# Patient Record
Sex: Male | Born: 1950 | Race: Black or African American | Hispanic: No | Smoking: Former smoker
Health system: Southern US, Community
[De-identification: ages and names within clinical notes are randomized; demographics above are authoritative.]

## PROBLEM LIST (undated history)

## (undated) DIAGNOSIS — K859 Acute pancreatitis without necrosis or infection, unspecified: Secondary | ICD-10-CM

---

## 2006-12-29 ENCOUNTER — Ambulatory Visit: Payer: Self-pay | Admitting: Gastroenterology

## 2008-03-31 ENCOUNTER — Emergency Department: Payer: Self-pay | Admitting: Emergency Medicine

## 2013-11-06 ENCOUNTER — Ambulatory Visit: Payer: Self-pay | Admitting: Urology

## 2013-11-06 DIAGNOSIS — I10 Essential (primary) hypertension: Secondary | ICD-10-CM

## 2013-11-19 ENCOUNTER — Ambulatory Visit: Payer: Self-pay | Admitting: Urology

## 2013-12-04 DIAGNOSIS — N529 Male erectile dysfunction, unspecified: Secondary | ICD-10-CM | POA: Insufficient documentation

## 2013-12-04 DIAGNOSIS — R339 Retention of urine, unspecified: Secondary | ICD-10-CM | POA: Insufficient documentation

## 2013-12-04 DIAGNOSIS — N401 Enlarged prostate with lower urinary tract symptoms: Secondary | ICD-10-CM | POA: Insufficient documentation

## 2015-02-21 NOTE — Op Note (Signed)
PATIENT NAME:  Ruben Johnson, Ruben Johnson MR#:  [id number] DATE OF BIRTH:  Jul 17, 1951  DATE OF PROCEDURE:  11/19/2013  PRINCIPAL DIAGNOSIS: Organic impotence.   POSTOPERATIVE DIAGNOSIS: Organic impotence.  PROCEDURE: Inflatable penile prosthesis placement.   SURGEON: Assunta GamblesBrian Ajia Chadderdon, M.D.   ANESTHESIA: Laryngeal mask airway anesthesia.   INDICATIONS: The patient is a 64 year old African American gentleman with a long history of organic impotence. He has undergone evaluation and [facility] at [facility]. He was a candidate for an inflatable penile prosthesis. There was a decision by [facility] to refer out for prosthesis placement. He presents for this purpose.   DESCRIPTION OF PROCEDURE: After informed consent was obtained, the patient was taken to the operating room and placed in the supine position on the operating room table under laryngeal mask airway anesthesia. The patient was then prepped and draped in the usual standard fashion. A 10 minute scrub and prep was performed. Ioban drapes were placed over the suprapubic and scrotal region. A 16-French Foley catheter was easily placed to gravity drainage. Gloves were changed at that point. A suprapubic incision was made approximately 5 cm over the pubic symphysis to the base of the penis. The incision was continued down to expose the corpora bilaterally. The incision was also taken down to expose the suprapubic fascia. Once the corpora were adequately exposed, a 2-0 Vicryl suture was placed both medial and lateral in the posterolateral corpora. Once the stay sutures were in place, the left corpora was initially opened utilizing electrocautery and an approximate 1.5 cm incision was made. Reasonable blood flow was noted within the corpora. The corpora were then dilated in standard technique to maximum dilation, both proximal and distal. This was easily performed. The corpora was then irrigated with ample amounts of antibiotic irrigating solution. The right corpora was  similarly opened. It too was dilated to maximum dilation sequentially without problems, both proximal and distal. The right corpora was also irrigated with ample amounts of antibiotic irrigating solution. Once the irrigation was completed, measurements were obtained of an 8 cm proximal bilateral measurement with a 12 cm distal measurement bilaterally. The decision was made to utilize an 18 cm prosthesis with 2 cm rear tips. Due to the penile size and girth, the decision was also made to utilize the more expandable prosthesis. The 100 mL reservoir was chosen. As the prosthesis and reservoir was being prepped, a suprapubic incision was made through the fascia. A large cavity was then developed posterior to the rectus muscle and fascia of more than adequate size for the 100 mL reservoir. The area was also irrigated with ample amounts of antibiotic irrigating solution. A pocket was also developed in the right hemiscrotum anteriorly. This was also easily performed. With the prosthesis and reservoir prepared, the reservoir was initially placed. It was filled according to manufacturer recommendations. No backflow pressure was identified through the syringe. This indicates adequate pocket for the reservoir. The right prosthesis was initially placed. The suture was advanced through the needle was advanced into the needle passer. The needle passer was inserted through the distal corpora to the glans penis. The needle was then passed through the glans penis pulling the suture through. The prosthesis was advanced into the distal corpora. The proximal portion was advanced into the more proximal corpora. Good positioning was noted. The left prosthesis was similarly placed passing the needle through the needle passer and glans penis. It was advanced distally and then proximally with good positioning noted. The prosthesis was then cycled with good  erection noted. No kinking or buckling of the device was appreciated. Adequate length  was noted behind the glans penis. The prosthesis was then partially deflated. The corpora were sutured bilaterally utilizing 2-0 Vicryl suture in a running fashion. The midline suprapubic fascia was also closed utilizing a 2-0 Vicryl suture. The pump was placed into the right hemiscrotum in good position. The areas were once again irrigated with ample amounts of antibiotic irrigating solution. The tubing was trimmed utilizing standard method. The connectors were placed. The tubing was then connected also in standard fashion. The tubing was tucked into the subcutaneous tissue. This was closed in 2 layers utilizing 3-0 Vicryl suture. A third layer was then placed utilizing plain gut suture. The skin was closed utilizing a running 4-0 Vicryl subcuticular stitch. Steri-Strips, Telfa, and Tegaderm dressing was then applied. The indwelling Foley catheter was removed. The sutures in the glans penis were cut and removed appropriately. Pressure was applied to the glans penis for hemostasis. The prosthesis was once again cycled. Good function of the device was noted. The prosthesis was then partially deflated. It was placed in the locked position. The patient was then awakened from laryngeal mask airway anesthesia, was taken to the recovery room in stable condition. There were no problems or complications. The patient tolerated the procedure well.  ____________________________ Madolyn Frieze. Achilles Dunk, MD bsc:sb D: 11/19/2013 13:15:42 ET T: 11/19/2013 14:54:51 ET JOB#: [id number]  cc: Madolyn Frieze. Achilles Dunk, MD, <Dictator> Madolyn Frieze Carlisia Geno MD ELECTRONICALLY SIGNED 11/19/2013 16:39

## 2018-02-07 ENCOUNTER — Emergency Department
Admission: EM | Admit: 2018-02-07 | Discharge: 2018-02-07 | Disposition: A | Discharge status: Home / Self Care | Payer: Medicare Other | Attending: Student in an Organized Health Care Education/Training Program | Admitting: Student in an Organized Health Care Education/Training Program

## 2018-02-07 ENCOUNTER — Encounter: Payer: Self-pay | Admitting: Medical Oncology

## 2018-02-07 DIAGNOSIS — W01198A Fall on same level from slipping, tripping and stumbling with subsequent striking against other object, initial encounter: Secondary | ICD-10-CM | POA: Insufficient documentation

## 2018-02-07 DIAGNOSIS — Y92008 Other place in unspecified non-institutional (private) residence as the place of occurrence of the external cause: Secondary | ICD-10-CM | POA: Insufficient documentation

## 2018-02-07 DIAGNOSIS — Z23 Encounter for immunization: Secondary | ICD-10-CM | POA: Diagnosis not present

## 2018-02-07 DIAGNOSIS — Y999 Unspecified external cause status: Secondary | ICD-10-CM | POA: Diagnosis not present

## 2018-02-07 DIAGNOSIS — Y9389 Activity, other specified: Secondary | ICD-10-CM | POA: Insufficient documentation

## 2018-02-07 DIAGNOSIS — S0990XA Unspecified injury of head, initial encounter: Secondary | ICD-10-CM | POA: Insufficient documentation

## 2018-02-07 MED ORDER — TETANUS-DIPHTH-ACELL PERTUSSIS 5-2.5-18.5 LF-MCG/0.5 IM SUSP
0.5000 mL | Freq: Once | INTRAMUSCULAR | Status: AC
  Administered 2018-02-07: 0.5 mL via INTRAMUSCULAR
  Filled 2018-02-07: qty 0.5

## 2018-02-07 MED ORDER — BACITRACIN ZINC 500 UNIT/GM EX OINT
TOPICAL_OINTMENT | Freq: Once | CUTANEOUS | Status: AC
  Administered 2018-02-07: 18:00:00 via TOPICAL
  Filled 2018-02-07: qty 0.9

## 2018-02-07 NOTE — ED Triage Notes (Signed)
Pt ambulatory with reports of tripping and falling today and hitting the top of his head on concrete. Pt has small lac, bleeding controlled, denies use of blood thinner. No LOC. A/O x 4.

## 2018-02-07 NOTE — ED Provider Notes (Signed)
[facility] Emergency Department Provider Note  ____________________________________________   First MD Initiated Contact with Patient 02/07/18 1722     (approximate)  I have reviewed the triage vital signs and the nursing notes.   HISTORY  Chief Complaint Laceration    HPI Ruben Johnson is a 67 y.o. male presents emergency department complaining of a head injury.  He states he was sitting in his chair on his patio.  He got up tripped and fell hitting the top of his head on concrete.  He did not lose consciousness.  He has no headache.  He is not had any nausea or vomiting.  He states he put a Band-Aid on top of his head where he scraped.  He does not take an aspirin a day or any blood thinners.  History reviewed. No pertinent past medical history.  There are no active problems to display for this patient.   History reviewed. No pertinent surgical history.  Prior to Admission medications   Not on File    Allergies Patient has no known allergies.  No family history on file.  Social History Social History   Tobacco Use  . Smoking status: Not on file  Substance Use Topics  . Alcohol use: Not on file  . Drug use: Not on file    Review of Systems  Constitutional: No fever/chills, positive head injury  eyes: No visual changes. ENT: No sore throat. Respiratory: Denies cough Genitourinary: Negative for dysuria. Musculoskeletal: Negative for back pain. Skin: Negative for rash.  Positive for abrasion on the scalp    ____________________________________________   PHYSICAL EXAM:  VITAL SIGNS: ED Triage Vitals  Enc Vitals Group     BP 02/07/18 1657 131/75     Pulse Rate 02/07/18 1657 80     Resp 02/07/18 1657 16     Temp 02/07/18 1656 98 F (36.7 C)     Temp Source 02/07/18 1657 Oral     SpO2 02/07/18 1657 95 %     Weight 02/07/18 1657 200 lb (90.7 kg)     Height 02/07/18 1657 6' (1.829 m)     Head Circumference --      Peak  Flow --      Pain Score 02/07/18 1657 0     Pain Loc --      Pain Edu? --      Excl. in GC? --     Constitutional: Alert and oriented. Well appearing and in no acute distress.  Patient is laughing and talking.  He is acting appropriately Eyes: Conjunctivae are normal.  Head:  positive for an abrasion on the anterior scalp just above the forehead. Nose: No congestion/rhinnorhea. Mouth/Throat: Mucous membranes are moist.   Cardiovascular: Normal rate, regular rhythm.  Heart sounds are normal Respiratory: Normal respiratory effort.  No retractions, lungs are clear to auscultation GU: deferred Musculoskeletal: FROM all extremities, warm and well perfused Neurologic:  Normal speech and language.  Hernial nerves II through XII are grossly intact Skin:  Skin is warm, dry positive for abrasion to the scalp psychiatric: Mood and affect are normal. Speech and behavior are normal.  ____________________________________________   LABS (all labs ordered are listed, but only abnormal results are displayed)  Labs Reviewed - No data to display ____________________________________________   ____________________________________________  RADIOLOGY    ____________________________________________   PROCEDURES  Procedure(s) performed: No  Procedures    ____________________________________________   INITIAL IMPRESSION / ASSESSMENT AND PLAN / ED COURSE  Pertinent labs &  imaging results that were available during my care of the patient were reviewed by me and considered in my medical decision making (see chart for details).  Patient is a 67 year old male presents emergency department complaining of a head injury.  He states he tripped and fell on his patio.  He has had on concrete.  He did not lose consciousness.  Has no headache or nausea or vomiting.  On physical exam he appears well.  There is a small abrasion on the anterior part of the scalp.  Cranial nerves II through XII grossly  intact.  He is acting appropriately.  Discussed the option of a CT of the head due to his age.  Patient states he would rather wait to see if he worsens before doing a head CT.  He states mainly he needed a Tdap and a bandage put on his head.  I agree that it would be appropriate to wait at this time on a CT.  He is to return to the emergency department as soon as possible if he is having any signs of headache, nausea, vomiting or any other neurologic changes.  He was given a Tdap by nursing staff prior to discharge.     As part of my medical decision making, I reviewed the following data within the electronic MEDICAL RECORD NUMBER Nursing notes reviewed and incorporated, Old chart reviewed, Notes from prior ED visits and [place] Controlled Substance Database  ____________________________________________   FINAL CLINICAL IMPRESSION(S) / ED DIAGNOSES  Final diagnoses:  Minor head injury, initial encounter      NEW MEDICATIONS STARTED DURING THIS VISIT:  New Prescriptions   No medications on file     Note:  This document was prepared using Dragon voice recognition software and may include unintentional dictation errors.    Faythe Ghee, PA-C 02/07/18 1753    Willy Eddy, MD 02/07/18 2207

## 2018-02-07 NOTE — Discharge Instructions (Addendum)
Return to the emergency department if you are having a headache, nausea, vomiting or feel like you are getting worse.  Apply ice to any areas that hurt.  Return the emergency department if needed

## 2018-02-07 NOTE — ED Notes (Signed)
Pt ambulatory without difficulty. VSS. NAD. Discharge instruction, and follow up discussed. All questions answered.

## 2018-02-26 ENCOUNTER — Other Ambulatory Visit: Payer: Self-pay

## 2018-02-26 ENCOUNTER — Emergency Department: Payer: Medicare Other

## 2018-02-26 ENCOUNTER — Emergency Department
Admission: EM | Admit: 2018-02-26 | Discharge: 2018-02-26 | Disposition: A | Discharge status: Home / Self Care | Payer: Medicare Other | Attending: Emergency Medicine | Admitting: Emergency Medicine

## 2018-02-26 ENCOUNTER — Encounter: Payer: Self-pay | Admitting: Emergency Medicine

## 2018-02-26 DIAGNOSIS — M899 Disorder of bone, unspecified: Secondary | ICD-10-CM | POA: Insufficient documentation

## 2018-02-26 DIAGNOSIS — Z202 Contact with and (suspected) exposure to infections with a predominantly sexual mode of transmission: Secondary | ICD-10-CM | POA: Diagnosis not present

## 2018-02-26 DIAGNOSIS — R360 Urethral discharge without blood: Secondary | ICD-10-CM | POA: Insufficient documentation

## 2018-02-26 LAB — URINALYSIS, COMPLETE (UACMP) WITH MICROSCOPIC
Bacteria, UA: NONE SEEN
Bilirubin Urine: NEGATIVE
Glucose, UA: NEGATIVE mg/dL
HGB URINE DIPSTICK: NEGATIVE
KETONES UR: NEGATIVE mg/dL
Nitrite: NEGATIVE
PROTEIN: NEGATIVE mg/dL
Specific Gravity, Urine: 1.01 (ref 1.005–1.030)
pH: 5 (ref 5.0–8.0)

## 2018-02-26 LAB — CHLAMYDIA/NGC RT PCR (ARMC ONLY)
Chlamydia Tr: NOT DETECTED
N gonorrhoeae: NOT DETECTED

## 2018-02-26 MED ORDER — CEFTRIAXONE SODIUM 250 MG IJ SOLR
250.0000 mg | Freq: Once | INTRAMUSCULAR | Status: AC
  Administered 2018-02-26: 250 mg via INTRAMUSCULAR
  Filled 2018-02-26: qty 250

## 2018-02-26 MED ORDER — METRONIDAZOLE 500 MG PO TABS
2000.0000 mg | ORAL_TABLET | Freq: Once | ORAL | Status: AC
  Administered 2018-02-26: 2000 mg via ORAL
  Filled 2018-02-26: qty 4

## 2018-02-26 MED ORDER — AZITHROMYCIN 500 MG PO TABS
1000.0000 mg | ORAL_TABLET | Freq: Once | ORAL | Status: AC
  Administered 2018-02-26: 1000 mg via ORAL
  Filled 2018-02-26: qty 2

## 2018-02-26 NOTE — ED Provider Notes (Signed)
[facility] Emergency Department Provider Note  ____________________________________________  Time seen: Approximately 9:51 AM  I have reviewed the triage vital signs and the nursing notes.   HISTORY  Chief Complaint Hand Pain and Exposure to STD    HPI Ruben Johnson is a 67 y.o. male that presents to the emergency department for concern of STD exposure thumb bone irregularity for 1 year.  Patient states that he was told by a lady friend that she has an STD.  Patient states that he has had some yellow discharge and itching recently.  Patient states that his thumb has protruded out for the last year.  He has been told that he has arthritis.  It is not painful and does not bother him.  No trauma.  No fever, chills, nausea, vomiting, abdominal pain, numbness, tingling, penile lesions.   History reviewed. No pertinent past medical history.  There are no active problems to display for this patient.   History reviewed. No pertinent surgical history.  Prior to Admission medications   Not on File    Allergies Patient has no known allergies.  No family history on file.  Social History Social History   Tobacco Use  . Smoking status: Never Smoker  . Smokeless tobacco: Never Used  Substance Use Topics  . Alcohol use: Never    Frequency: Never  . Drug use: Never     Review of Systems  Constitutional: No fever/chills Cardiovascular: No chest pain. Respiratory: No SOB. Gastrointestinal: No abdominal pain.  No nausea, no vomiting.  Musculoskeletal: Negative for musculoskeletal pain. Skin: Negative for rash, abrasions, lacerations, ecchymosis. Neurological: Negative for numbness or tingling   ____________________________________________   PHYSICAL EXAM:  VITAL SIGNS: ED Triage Vitals [02/26/18 0655]  Enc Vitals Group     BP (!) 170/100     Pulse Rate 82     Resp 20     Temp 97.8 F (36.6 C)     Temp Source Oral     SpO2 98 %     Weight  200 lb (90.7 kg)     Height 6' (1.829 m)     Head Circumference      Peak Flow      Pain Score 0     Pain Loc      Pain Edu?      Excl. in GC?      Constitutional: Alert and oriented. Well appearing and in no acute distress. Eyes: Conjunctivae are normal. PERRL. EOMI. Head: Atraumatic. ENT:      Ears:      Nose: No congestion/rhinnorhea.      Mouth/Throat: Mucous membranes are moist.  Neck: No stridor.   Cardiovascular: Normal rate, regular rhythm.  Good peripheral circulation. Respiratory: Normal respiratory effort without tachypnea or retractions. Lungs CTAB. Good air entry to the bases with no decreased or absent breath sounds. Gastrointestinal: Bowel sounds 4 quadrants. Soft and nontender to palpation. No guarding or rigidity. No palpable masses. No distention.  Genitourinary: deferred Musculoskeletal: Full range of motion to all extremities. No gross deformities appreciated. Bony deformity of MCP joint of right thumb.  Neurologic:  Normal speech and language. No gross focal neurologic deficits are appreciated.  Skin:  Skin is warm, dry and intact. No rash noted. Psychiatric: Mood and affect are normal. Speech and behavior are normal. Patient exhibits appropriate insight and judgement.   ____________________________________________   LABS (all labs ordered are listed, but only abnormal results are displayed)  Labs Reviewed  URINALYSIS,  COMPLETE (UACMP) WITH MICROSCOPIC - Abnormal; Notable for the following components:      Result Value   Color, Urine YELLOW (*)    APPearance CLEAR (*)    Leukocytes, UA MODERATE (*)    All other components within normal limits  CHLAMYDIA/NGC RT PCR ([facility] ONLY)  URINE CULTURE   ____________________________________________  EKG   ____________________________________________  RADIOLOGY Lexine Baton, personally viewed and evaluated these images (plain radiographs) as part of my medical decision making, as well as  reviewing the written report by the radiologist.  Dg Hand Complete Left  Result Date: 02/26/2018 CLINICAL DATA:  Limited range of motion of left hand without known injury or pain. EXAM: LEFT HAND - COMPLETE 3+ VIEW COMPARISON:  None. FINDINGS: There is no evidence of fracture or dislocation. There is no evidence of arthropathy or other focal bone abnormality. Soft tissues are unremarkable. IMPRESSION: Normal left hand. Electronically Signed   By: Lupita Raider, M.D.   On: 02/26/2018 07:44    ____________________________________________    PROCEDURES  Procedure(s) performed:    Procedures    Medications  cefTRIAXone (ROCEPHIN) injection 250 mg (250 mg Intramuscular Given 02/26/18 0920)  azithromycin (ZITHROMAX) tablet 1,000 mg (1,000 mg Oral Given 02/26/18 0920)  metroNIDAZOLE (FLAGYL) tablet 2,000 mg (2,000 mg Oral Given 02/26/18 0920)     ____________________________________________   INITIAL IMPRESSION / ASSESSMENT AND PLAN / ED COURSE  Pertinent labs & imaging results that were available during my care of the patient were reviewed by me and considered in my medical decision making (see chart for details).  Review of the [place] CSRS was performed in accordance of the [facility] prior to dispensing any controlled drugs.   Patient's diagnosis is consistent with exposure to STD and symptoms consistent with arhrtitis. Vital signs and exam are reassuring. Hand xray negative for acute bony abnormalities. Exam is consistent with arthritis. Patient was given IM Ceftriaxone, oral azithromycin, oral flagyl for exposure to STD. Education about STDs was provided. Patient is to follow up with PCP as directed. Patient is given ED precautions to return to the ED for any worsening or new symptoms.   ____________________________________________  FINAL CLINICAL IMPRESSION(S) / ED DIAGNOSES  Final diagnoses:  Exposure to STD  Bone disorder      NEW MEDICATIONS STARTED DURING THIS VISIT:  ED  Discharge Orders    None          This chart was dictated using voice recognition software/Dragon. Despite best efforts to proofread, errors can occur which can change the meaning. Any change was purely unintentional.    Enid Derry, PA-C 02/26/18 1029    Don Perking, [place], MD 02/28/18 [phone]

## 2018-02-26 NOTE — ED Notes (Signed)
See triage note  Having pain and swelling to left hand  Mainly at index knuckle  Also states his lady friend told him to be checked for STD  He has had some penile discharge with some itching

## 2018-02-26 NOTE — ED Triage Notes (Signed)
Pt arrives ambulatory to triage with c/o left hand pain without injury and possible STD. Pt reports that he received a call from a "lady friend" that he may need to be checked out. Pt reports penile DC and itching. Pt is in NAD.

## 2018-02-27 LAB — URINE CULTURE: Culture: NO GROWTH

## 2018-06-01 ENCOUNTER — Emergency Department: Payer: Medicare Other

## 2018-06-01 ENCOUNTER — Encounter: Payer: Self-pay | Admitting: Emergency Medicine

## 2018-06-01 ENCOUNTER — Inpatient Hospital Stay
Admission: EM | Admit: 2018-06-01 | Discharge: 2018-06-04 | DRG: 440 | Disposition: A | Discharge status: Home / Self Care | Payer: Medicare Other | Attending: Internal Medicine | Admitting: Internal Medicine

## 2018-06-01 ENCOUNTER — Other Ambulatory Visit: Payer: Self-pay

## 2018-06-01 DIAGNOSIS — R1013 Epigastric pain: Secondary | ICD-10-CM | POA: Diagnosis present

## 2018-06-01 DIAGNOSIS — E876 Hypokalemia: Secondary | ICD-10-CM | POA: Diagnosis not present

## 2018-06-01 DIAGNOSIS — K760 Fatty (change of) liver, not elsewhere classified: Secondary | ICD-10-CM | POA: Diagnosis present

## 2018-06-01 DIAGNOSIS — Z87891 Personal history of nicotine dependence: Secondary | ICD-10-CM

## 2018-06-01 DIAGNOSIS — K852 Alcohol induced acute pancreatitis without necrosis or infection: Secondary | ICD-10-CM | POA: Diagnosis present

## 2018-06-01 DIAGNOSIS — K859 Acute pancreatitis without necrosis or infection, unspecified: Secondary | ICD-10-CM | POA: Diagnosis present

## 2018-06-01 LAB — CBC
HCT: 50.9 % (ref 40.0–52.0)
Hemoglobin: 17.6 g/dL (ref 13.0–18.0)
MCH: 31.9 pg (ref 26.0–34.0)
MCHC: 34.7 g/dL (ref 32.0–36.0)
MCV: 92 fL (ref 80.0–100.0)
Platelets: 430 10*3/uL (ref 150–440)
RBC: 5.53 MIL/uL (ref 4.40–5.90)
RDW: 13.5 % (ref 11.5–14.5)
WBC: 10.6 10*3/uL (ref 3.8–10.6)

## 2018-06-01 LAB — COMPREHENSIVE METABOLIC PANEL
ALT: 29 U/L (ref 0–44)
AST: 29 U/L (ref 15–41)
Albumin: 4.3 g/dL (ref 3.5–5.0)
Alkaline Phosphatase: 65 U/L (ref 38–126)
Anion gap: 16 — ABNORMAL HIGH (ref 5–15)
BILIRUBIN TOTAL: 0.9 mg/dL (ref 0.3–1.2)
BUN: 9 mg/dL (ref 8–23)
CO2: 26 mmol/L (ref 22–32)
Calcium: 9.1 mg/dL (ref 8.9–10.3)
Chloride: 100 mmol/L (ref 98–111)
Creatinine, Ser: 0.94 mg/dL (ref 0.61–1.24)
GFR calc Af Amer: >60 mL/min (ref >60)
GFR calc non Af Amer: >60 mL/min (ref >60)
GLUCOSE: 187 mg/dL — AB (ref 70–99)
Potassium: 3.8 mmol/L (ref 3.5–5.1)
Sodium: 142 mmol/L (ref 135–145)
Total Protein: 7.9 g/dL (ref 6.5–8.1)

## 2018-06-01 LAB — TROPONIN I: Troponin I: <0.03 ng/mL (ref <0.03)

## 2018-06-01 LAB — LIPASE, BLOOD: LIPASE: 1310 U/L — AB (ref 11–51)

## 2018-06-01 MED ORDER — ONDANSETRON HCL 4 MG PO TABS: 4.0000 mg | ORAL_TABLET | Freq: Four times a day (QID) | ORAL | Status: DC | PRN

## 2018-06-01 MED ORDER — ONDANSETRON HCL 4 MG/2ML IJ SOLN: 4.0000 mg | Freq: Four times a day (QID) | INTRAMUSCULAR | Status: DC | PRN

## 2018-06-01 MED ORDER — THIAMINE HCL 100 MG/ML IJ SOLN
100.0000 mg | Freq: Every day | INTRAMUSCULAR | Status: DC
  Administered 2018-06-01: 100 mg via INTRAVENOUS
  Filled 2018-06-01 (×4): qty 1

## 2018-06-01 MED ORDER — SODIUM CHLORIDE 0.9 % IV BOLUS
1000.0000 mL | Freq: Once | INTRAVENOUS | Status: AC
Start: 2018-06-01 — End: 2018-06-01
  Administered 2018-06-01: 1000 mL via INTRAVENOUS

## 2018-06-01 MED ORDER — HYDRALAZINE HCL 20 MG/ML IJ SOLN: 5.0000 mg | INTRAMUSCULAR | Status: DC | PRN

## 2018-06-01 MED ORDER — DEXTROSE-NACL 5-0.45 % IV SOLN
INTRAVENOUS | Status: DC
  Administered 2018-06-01 – 2018-06-03 (×4): via INTRAVENOUS

## 2018-06-01 MED ORDER — IOHEXOL 300 MG/ML  SOLN
100.0000 mL | Freq: Once | INTRAMUSCULAR | Status: AC | PRN
Start: 2018-06-01 — End: 2018-06-01
  Administered 2018-06-01: 100 mL via INTRAVENOUS

## 2018-06-01 MED ORDER — ENOXAPARIN SODIUM 40 MG/0.4ML ~~LOC~~ SOLN
40.0000 mg | SUBCUTANEOUS | Status: DC
  Administered 2018-06-01 – 2018-06-03 (×3): 40 mg via SUBCUTANEOUS
  Filled 2018-06-01 (×3): qty 0.4

## 2018-06-01 MED ORDER — LORAZEPAM 1 MG PO TABS: 1.0000 mg | ORAL_TABLET | Freq: Four times a day (QID) | ORAL | Status: DC | PRN

## 2018-06-01 MED ORDER — PROMETHAZINE HCL 25 MG/ML IJ SOLN
12.5000 mg | Freq: Four times a day (QID) | INTRAMUSCULAR | Status: DC | PRN
  Administered 2018-06-01: 12.5 mg via INTRAVENOUS
  Filled 2018-06-01: qty 1

## 2018-06-01 MED ORDER — VITAMIN B-1 100 MG PO TABS
100.0000 mg | ORAL_TABLET | Freq: Every day | ORAL | Status: DC
  Administered 2018-06-03 – 2018-06-04 (×2): 100 mg via ORAL
  Filled 2018-06-01 (×2): qty 1

## 2018-06-01 MED ORDER — LORAZEPAM 2 MG/ML IJ SOLN: 1.0000 mg | Freq: Four times a day (QID) | INTRAMUSCULAR | Status: DC | PRN

## 2018-06-01 MED ORDER — FOLIC ACID 1 MG PO TABS
1.0000 mg | ORAL_TABLET | Freq: Every day | ORAL | Status: DC
  Administered 2018-06-03 – 2018-06-04 (×2): 1 mg via ORAL
  Filled 2018-06-01 (×2): qty 1

## 2018-06-01 MED ORDER — ADULT MULTIVITAMIN W/MINERALS CH
1.0000 | ORAL_TABLET | Freq: Every day | ORAL | Status: DC
  Administered 2018-06-03 – 2018-06-04 (×2): 1 via ORAL
  Filled 2018-06-01 (×2): qty 1

## 2018-06-01 MED ORDER — FENTANYL CITRATE (PF) 100 MCG/2ML IJ SOLN
50.0000 ug | INTRAMUSCULAR | Status: DC | PRN
  Administered 2018-06-01: 50 ug via INTRAVENOUS

## 2018-06-01 MED ORDER — MORPHINE SULFATE (PF) 4 MG/ML IV SOLN
4.0000 mg | INTRAVENOUS | Status: DC | PRN
  Administered 2018-06-01 – 2018-06-03 (×10): 4 mg via INTRAVENOUS
  Filled 2018-06-01 (×10): qty 1

## 2018-06-01 MED ORDER — FENTANYL CITRATE (PF) 100 MCG/2ML IJ SOLN
INTRAMUSCULAR | Status: AC
  Filled 2018-06-01: qty 2

## 2018-06-01 NOTE — ED Notes (Signed)
Rachel RN, aware of bed assigned  

## 2018-06-01 NOTE — ED Provider Notes (Signed)
Ruben Johnson Provider Note    First MD Initiated Contact with Patient 06/01/18 1502     (approximate)  I have reviewed the triage vital signs and the nursing notes.   HISTORY  Chief Complaint Abdominal Pain    HPI Ruben Johnson is a 67 y.o. male with a history of alcohol use presents the ER with chief complaint of mid epigastric abdominal pain nonradiating that woke him from sleep at 6 AM.  Patient states he had multiple beers last night.  Is not been passing gas today.  No bowel movements.  No previous abdominal surgeries.  Has not taken anything for the pain.  States the pain is moderate to severe.  No chest pain or shortness of breath.  No fevers.  No new medications.  Is never had pain like this before.    History reviewed. No pertinent past medical history. History reviewed. No pertinent family history. History reviewed. No pertinent surgical history. There are no active problems to display for this patient.     Prior to Admission medications   Not on File    Allergies Patient has no known allergies.    Social History Social History   Tobacco Use  . Smoking status: Former Games developer  . Smokeless tobacco: Never Used  Substance Use Topics  . Alcohol use: Never    Frequency: Never  . Drug use: Never    Review of Systems Patient denies headaches, rhinorrhea, blurry vision, numbness, shortness of breath, chest pain, edema, cough, abdominal pain, nausea, vomiting, diarrhea, dysuria, fevers, rashes or hallucinations unless otherwise stated above in HPI. ____________________________________________   PHYSICAL EXAM:  VITAL SIGNS: Vitals:   06/01/18 1447 06/01/18 1500  BP:  138/64  Pulse: 81 75  Resp:    SpO2: (!) 89% 93%    Constitutional: Alert and oriented.  Eyes: Conjunctivae are normal.  Head: Atraumatic. Nose: No congestion/rhinnorhea. Mouth/Throat: Mucous membranes are moist.   Neck: No stridor.  Painless ROM.  Cardiovascular: Normal rate, regular rhythm. Grossly normal heart sounds.  Good peripheral circulation. Respiratory: Normal respiratory effort.  No retractions. Lungs CTAB. Gastrointestinal: Soft diffusely tender without peritonitis. No distention. No abdominal bruits. No CVA tenderness. Genitourinary: deferred Musculoskeletal: No lower extremity tenderness nor edema.  No joint effusions. Neurologic:  Normal speech and language. No gross focal neurologic deficits are appreciated. No facial droop Skin:  Skin is warm, dry and intact. No rash noted. Psychiatric: Mood and affect are normal. Speech and behavior are normal.  ____________________________________________   LABS (all labs ordered are listed, but only abnormal results are displayed)  Results for orders placed or performed during the hospital encounter of 06/01/18 (from the past 24 hour(s))  CBC     Status: None   Collection Time: 06/01/18  1:49 PM  Result Value Ref Range   WBC 10.6 3.8 - 10.6 K/uL   RBC 5.53 4.40 - 5.90 MIL/uL   Hemoglobin 17.6 13.0 - 18.0 g/dL   HCT 78.4 69.6 - 29.5 %   MCV 92.0 80.0 - 100.0 fL   MCH 31.9 26.0 - 34.0 pg   MCHC 34.7 32.0 - 36.0 g/dL   RDW 28.4 13.2 - 44.0 %   Platelets 430 150 - 440 K/uL  Comprehensive metabolic panel     Status: Abnormal   Collection Time: 06/01/18  2:55 PM  Result Value Ref Range   Sodium 142 135 - 145 mmol/L   Potassium 3.8 3.5 - 5.1 mmol/L   Chloride 100  98 - 111 mmol/L   CO2 26 22 - 32 mmol/L   Glucose, Bld 187 (H) 70 - 99 mg/dL   BUN 9 8 - 23 mg/dL   Creatinine, Ser 4.090.94 0.61 - 1.24 mg/dL   Calcium 9.1 8.9 - 81.110.3 mg/dL   Total Protein 7.9 6.5 - 8.1 g/dL   Albumin 4.3 3.5 - 5.0 g/dL   AST 29 15 - 41 U/L   ALT 29 0 - 44 U/L   Alkaline Phosphatase 65 38 - 126 U/L   Total Bilirubin 0.9 0.3 - 1.2 mg/dL   GFR calc non Af Amer >60 >60 mL/min   GFR calc Af Amer >60 >60 mL/min   Anion gap 16 (H) 5 - 15  Lipase, blood     Status: Abnormal    Collection Time: 06/01/18  2:55 PM  Result Value Ref Range   Lipase 1,310 (H) 11 - 51 U/L   ____________________________________________  EKG My review and personal interpretation at Time: 13:35   Indication: abd pain  Rate: 80  Rhythm: sinus Axis: normal Other: normal intervals, no stemi ____________________________________________  RADIOLOGY  I personally reviewed all radiographic images ordered to evaluate for the above acute complaints and reviewed radiology reports and findings.  These findings were personally discussed with the patient.  Please see medical record for radiology report.  ____________________________________________   PROCEDURES  Procedure(s) performed:  Procedures    Critical Care performed: no ____________________________________________   INITIAL IMPRESSION / ASSESSMENT AND PLAN / ED COURSE  Pertinent labs & imaging results that were available during my care of the patient were reviewed by me and considered in my medical decision making (see chart for details).   DDX: panceratitis, cholelithiasis, cholelcystitis, enteritis, sbo, AAA, dehydration  Ruben Johnson is a 67 y.o. who presents to the ED with symptoms as described above.  Patient afebrile hemodynamically stable but is very uncomfortable appearing.  Blood work will be sent for the above differential.  Will provide IV fluids as well as IV pain medication IV antiemetics.  CT imaging of the abdomen pelvis will be ordered for the above differential.  The patient will be placed on continuous pulse oximetry and telemetry for monitoring.  Laboratory evaluation will be sent to evaluate for the above complaints.     Clinical Course as of Jun 01 1628  Fri Jun 01, 2018  1555 My review of the CT abdomen it does appear the patient has acute pancreatitis.   [PR]  1624 His confirms acute pancreatitis at 1300.  Based on patient's persistent pain and symptoms patient will require hospitalization to keep  n.p.o. and for pain control.   [PR]    Clinical Course User Index [PR] Ruben Johnson, Ruben Brookshire, MD     As part of my medical decision making, I reviewed the following data within the electronic MEDICAL RECORD NUMBER Nursing notes reviewed and incorporated, Labs reviewed, notes from prior ED visits.   ____________________________________________   FINAL CLINICAL IMPRESSION(S) / ED DIAGNOSES  Final diagnoses:  Alcohol-induced acute pancreatitis without infection or necrosis  Epigastric pain      NEW MEDICATIONS STARTED DURING THIS VISIT:  New Prescriptions   No medications on file     Note:  This document was prepared using Dragon voice recognition software and may include unintentional dictation errors.    Ruben Johnson, Damontae Loppnow, MD 06/01/18 [phone]

## 2018-06-01 NOTE — ED Notes (Signed)
Report given to Dedra, RN.  

## 2018-06-01 NOTE — Plan of Care (Signed)
  Problem: Education: Goal: Knowledge of General Education information will improve Description: Including pain rating scale, medication(s)/side effects and non-pharmacologic comfort measures Outcome: Progressing   Problem: Health Behavior/Discharge Planning: Goal: Ability to manage health-related needs will improve Outcome: Progressing   Problem: Pain Managment: Goal: General experience of comfort will improve Outcome: Progressing   

## 2018-06-01 NOTE — H&P (Addendum)
[facility] - [place] at [facility]   PATIENT NAME: Ruben Johnson    MR#:  [id number]  DATE OF BIRTH:  06-04-1951  DATE OF ADMISSION:  06/01/2018  PRIMARY CARE PHYSICIAN: Patient, No Pcp Per   REQUESTING/REFERRING PHYSICIAN: Willy Eddy, MD  CHIEF COMPLAINT:   Chief Complaint  Patient presents with  . Abdominal Pain    HISTORY OF PRESENT ILLNESS:  Ruben Johnson  is a 67 y.o. male with a known history of alcohol use presenting to the ED with epigastric abdominal pain that started this morning around 6 AM.  Patient describes the pain as "gnawing".  He has not tried taking anything for the pain.  He has had nausea and 8 episodes of emesis today.  His last bowel movement was yesterday and was normal.  He denies any hematemesis or hematochezia.  He denies any fevers or chills.  No chest pain or shortness of breath.  This is never happened to him before.  He drinks 3-4 beers daily.  PAST MEDICAL HISTORY:  History reviewed. No pertinent past medical history.  PAST SURGICAL HISTORY:  History reviewed. No pertinent surgical history.    No chest pain  SOCIAL HISTORY:   Social History   Tobacco Use  . Smoking status: Former Games developer  . Smokeless tobacco: Never Used  Substance Use Topics  . Alcohol use: Never    Frequency: Never    FAMILY HISTORY:  Father- cancer (unknowne type)  DRUG ALLERGIES:  No Known Allergies  REVIEW OF SYSTEMS:   Review of Systems  Constitutional: Negative for chills and fever.  HENT: Negative for congestion and sore throat.   Eyes: Negative for blurred vision and double vision.  Respiratory: Negative for cough and shortness of breath.   Cardiovascular: Negative for chest pain and leg swelling.  Gastrointestinal: Positive for abdominal pain, nausea and vomiting. Negative for blood in stool, constipation, diarrhea and melena.  Genitourinary: Negative for dysuria and frequency.  Musculoskeletal: Negative for back pain and neck  pain.  Neurological: Negative for dizziness and headaches.  Psychiatric/Behavioral: Negative for depression. The patient is not nervous/anxious.      MEDICATIONS AT HOME:   Prior to Admission medications   Not on File      VITAL SIGNS:  Blood pressure 138/64, pulse 75, resp. rate (!) 22, height 6' (1.829 m), weight 90.7 kg (200 lb), SpO2 93 %.  PHYSICAL EXAMINATION:  Physical Exam  GENERAL:  68 y.o.-year-old patient lying in the bed, appears to be in pain. EYES: Pupils equal, round, reactive to light and accommodation. No scleral icterus. Extraocular muscles intact.  HEENT: Head atraumatic, normocephalic. Oropharynx and nasopharynx clear.  NECK:  Supple, no jugular venous distention. No thyroid enlargement, no tenderness.  LUNGS: Normal breath sounds bilaterally, no wheezing, rales, rhonchi or crepitation. No use of accessory muscles of respiration.  CARDIOVASCULAR: S1, S2 normal. No murmurs, rubs, or gallops.  ABDOMEN: Soft, significant tenderness to palpation of the epigastric area.  Bowel sounds present. No organomegaly or mass.  No rebound, no guarding.  Murphy's sign negative. EXTREMITIES: No pedal edema, cyanosis, or clubbing.  NEUROLOGIC: Cranial nerves II through XII are intact. Muscle strength 5/5 in all extremities. Sensation intact. Gait not checked.  PSYCHIATRIC: The patient is alert and oriented x 3.  SKIN: No obvious rash, lesion, or ulcer.   LABORATORY PANEL:   CBC Recent Labs  Lab 06/01/18 1349  WBC 10.6  HGB 17.6  HCT 50.9  PLT 430   ------------------------------------------------------------------------------------------------------------------  Chemistries  Recent Labs  Lab 06/01/18 1455  NA 142  K 3.8  CL 100  CO2 26  GLUCOSE 187*  BUN 9  CREATININE 0.94  CALCIUM 9.1  AST 29  ALT 29  ALKPHOS 65  BILITOT 0.9   ------------------------------------------------------------------------------------------------------------------  Cardiac  Enzymes No results for input(s): TROPONINI in the last 168 hours. ------------------------------------------------------------------------------------------------------------------  RADIOLOGY:  Ct Abdomen Pelvis W Contrast  Result Date: 06/01/2018 CLINICAL DATA:  Nausea and vomiting. Bowel obstruction, high-grade. The abdominal pain, acute, generalized. Symptoms began at 6 a.m. today. EXAM: CT ABDOMEN AND PELVIS WITH CONTRAST TECHNIQUE: Multidetector CT imaging of the abdomen and pelvis was performed using the standard protocol following bolus administration of intravenous contrast. CONTRAST:  OMNIPAQUE IOHEXOL 300 MG/ML  SOLN COMPARISON:  None. FINDINGS: Lower chest: Mild dependent atelectasis is present at both lung bases. Heart size is normal. No significant pleural or pericardial effusion is present. Hepatobiliary: There is diffuse fatty infiltration liver. No focal lesions are present. The common bile duct and gallbladder are normal. Pancreas: Extensive inflammatory changes are present at the pancreas. No abscess or fluid collection is present. There is no cystic lesion or obstruction. Spleen: Normal in size without focal abnormality. Adrenals/Urinary Tract: Adrenal glands are within normal limits bilaterally. A 2 cm simple cyst is noted anteriorly in the left kidney. No other focal mass lesion is present. There is no stone. Ureters are within normal limits. The urinary bladder is normal. Stomach/Bowel: There is mild distention of the stomach. Secondary inflammatory changes are present in the duodenum. This may result in some functional obstruction. Small bowel is otherwise unremarkable. Terminal ileum is within normal limits. The appendix is visualized and normal. The ascending and transverse colon is normal. Descending and sigmoid colon are mostly collapsed. Vascular/Lymphatic: Atherosclerotic calcifications are present in the aorta without aneurysm. No significant adenopathy is present.  Reproductive: Penile prosthesis is noted. Prostate is mildly enlarged. Other: No abdominal wall hernia or abnormality. Retroperitoneal fluid is noted at the level the pancreas, asymmetric to the right. Musculoskeletal: Vertebral body heights alignment are maintained. Pelvis is normal. Degenerative changes are noted at the hips bilaterally. IMPRESSION: 1. Acute pancreatitis without complicating features. No pseudocyst or abscess. 2. No obstructing mass. 3. Retroperitoneal reactive fluid is asymmetric to the right. 4.  Aortic Atherosclerosis (ICD10-I70.0). 5. Hepatic steatosis. Electronically Signed   By: Marin Roberts M.D.   On: 06/01/2018 15:58      IMPRESSION AND PLAN:   1. Acute pancreatitis- likely due to alcohol use. Lipase 1300. CT abdomen/pelvis did not show pseudocyst or abscess, also showed normal-appearing gallbladder and CBD. - Morphine for pain, Zofran for nausea - NPO for now, can advance diet as tolerated - MIVFs  2. Alcohol use- drinks 3-4 beers daily - CIWA - MVI, thiamine, folate  3. Elevated BP- does not have a history of HTN and not on any BP meds at home. Intermittently elevated BPs in the ED may be secondary to pain. - IV Hydralazine prn - Monitor  All the records are reviewed and case discussed with ED provider. Management plans discussed with the patient, family and they are in agreement.  CODE STATUS: FULL  TOTAL TIME TAKING CARE OF THIS PATIENT: 35 minutes.    Ruben Johnson M.D on 06/01/2018 at 4:38 PM  Between 7am to 6pm - Pager - [phone]  After 6pm go to [email or web address] - Social research officer, government  [facility] [place] [facility]  Office  [phone]  CC: Primary care physician; Patient, No  Pcp Per   Note: This dictation was prepared with Dragon dictation along with smaller phrase technology. Any transcriptional errors that result from this process are unintentional.

## 2018-06-01 NOTE — ED Triage Notes (Signed)
Pt arrived via EMS from home with reports of generalized abdominal pain that started around 6am this morning.  Pt c/o nausea and vomiting as well.   Pt describes the pain as constant and is sitting bent over in wheelchair.

## 2018-06-01 NOTE — ED Notes (Signed)
Attempted to call report. Floor refusing to accept report at this time. Will call back in 10 minutes.

## 2018-06-02 LAB — COMPREHENSIVE METABOLIC PANEL
ALT: 22 U/L (ref 0–44)
AST: 25 U/L (ref 15–41)
Albumin: 3.7 g/dL (ref 3.5–5.0)
Alkaline Phosphatase: 58 U/L (ref 38–126)
Anion gap: 7 (ref 5–15)
BILIRUBIN TOTAL: 0.5 mg/dL (ref 0.3–1.2)
BUN: 10 mg/dL (ref 8–23)
CALCIUM: 8.5 mg/dL — AB (ref 8.9–10.3)
CHLORIDE: 104 mmol/L (ref 98–111)
CO2: 29 mmol/L (ref 22–32)
Creatinine, Ser: 0.7 mg/dL (ref 0.61–1.24)
Glucose, Bld: 188 mg/dL — ABNORMAL HIGH (ref 70–99)
Potassium: 4 mmol/L (ref 3.5–5.1)
Sodium: 140 mmol/L (ref 135–145)
TOTAL PROTEIN: 7.2 g/dL (ref 6.5–8.1)

## 2018-06-02 LAB — CBC
HEMATOCRIT: 51 % (ref 40.0–52.0)
Hemoglobin: 17.6 g/dL (ref 13.0–18.0)
MCH: 31.8 pg (ref 26.0–34.0)
MCHC: 34.6 g/dL (ref 32.0–36.0)
MCV: 92 fL (ref 80.0–100.0)
PLATELETS: 207 10*3/uL (ref 150–440)
RBC: 5.54 MIL/uL (ref 4.40–5.90)
RDW: 13.9 % (ref 11.5–14.5)
WBC: 8.2 10*3/uL (ref 3.8–10.6)

## 2018-06-02 LAB — LIPASE, BLOOD: LIPASE: 992 U/L — AB (ref 11–51)

## 2018-06-02 NOTE — Progress Notes (Signed)
[facility] - [place] at [facility]   PATIENT NAME: Ruben NielsenSherman Johnson    MR#:  [id number]  DATE OF BIRTH:  07/22/1951  SUBJECTIVE:  CHIEF COMPLAINT:   Chief Complaint  Patient presents with  . Abdominal Pain  feels some better, pain 7/10, morphine helps. Says it's first time and wish no one gets this disease, agreeable to stay NPO REVIEW OF SYSTEMS:  Review of Systems  Constitutional: Negative for chills, fever and weight loss.  HENT: Negative for nosebleeds and sore throat.   Eyes: Negative for blurred vision.  Respiratory: Negative for cough, shortness of breath and wheezing.   Cardiovascular: Negative for chest pain, orthopnea, leg swelling and PND.  Gastrointestinal: Positive for abdominal pain. Negative for constipation, diarrhea, heartburn, nausea and vomiting.  Genitourinary: Negative for dysuria and urgency.  Musculoskeletal: Negative for back pain.  Skin: Negative for rash.  Neurological: Negative for dizziness, speech change, focal weakness and headaches.  Endo/Heme/Allergies: Does not bruise/bleed easily.  Psychiatric/Behavioral: Negative for depression.   DRUG ALLERGIES:  No Known Allergies VITALS:  Blood pressure (!) 145/86, pulse 80, temperature 98.8 F (37.1 C), temperature source Oral, resp. rate 18, height 6' (1.829 m), weight 87 kg (191 lb 14.4 oz), SpO2 95 %. PHYSICAL EXAMINATION:  Physical Exam  Constitutional: He is oriented to person, place, and time.  HENT:  Head: Normocephalic and atraumatic.  Eyes: Pupils are equal, round, and reactive to light. Conjunctivae and EOM are normal.  Neck: Normal range of motion. Neck supple. No tracheal deviation present. No thyromegaly present.  Cardiovascular: Normal rate, regular rhythm and normal heart sounds.  Pulmonary/Chest: Effort normal and breath sounds normal. No respiratory distress. He has no wheezes. He exhibits no tenderness.  Abdominal: Soft. Bowel sounds are normal. He exhibits  distension. There is generalized tenderness.  Musculoskeletal: Normal range of motion.  Neurological: He is alert and oriented to person, place, and time. No cranial nerve deficit.  Skin: Skin is warm and dry. No rash noted.   LABORATORY PANEL:  Male CBC Recent Labs  Lab 06/02/18 0344  WBC 8.2  HGB 17.6  HCT 51.0  PLT 207   ------------------------------------------------------------------------------------------------------------------ Chemistries  Recent Labs  Lab 06/02/18 0344  NA 140  K 4.0  CL 104  CO2 29  GLUCOSE 188*  BUN 10  CREATININE 0.70  CALCIUM 8.5*  AST 25  ALT 22  ALKPHOS 58  BILITOT 0.5   RADIOLOGY:  Ct Abdomen Pelvis W Contrast  Result Date: 06/01/2018 CLINICAL DATA:  Nausea and vomiting. Bowel obstruction, high-grade. The abdominal pain, acute, generalized. Symptoms began at 6 a.m. today. EXAM: CT ABDOMEN AND PELVIS WITH CONTRAST TECHNIQUE: Multidetector CT imaging of the abdomen and pelvis was performed using the standard protocol following bolus administration of intravenous contrast. CONTRAST:  100mL OMNIPAQUE IOHEXOL 300 MG/ML  SOLN COMPARISON:  None. FINDINGS: Lower chest: Mild dependent atelectasis is present at both lung bases. Heart size is normal. No significant pleural or pericardial effusion is present. Hepatobiliary: There is diffuse fatty infiltration liver. No focal lesions are present. The common bile duct and gallbladder are normal. Pancreas: Extensive inflammatory changes are present at the pancreas. No abscess or fluid collection is present. There is no cystic lesion or obstruction. Spleen: Normal in size without focal abnormality. Adrenals/Urinary Tract: Adrenal glands are within normal limits bilaterally. A 2 cm simple cyst is noted anteriorly in the left kidney. No other focal mass lesion is present. There is no stone. Ureters are within normal limits. The  urinary bladder is normal. Stomach/Bowel: There is mild distention of the stomach.  Secondary inflammatory changes are present in the duodenum. This may result in some functional obstruction. Small bowel is otherwise unremarkable. Terminal ileum is within normal limits. The appendix is visualized and normal. The ascending and transverse colon is normal. Descending and sigmoid colon are mostly collapsed. Vascular/Lymphatic: Atherosclerotic calcifications are present in the aorta without aneurysm. No significant adenopathy is present. Reproductive: Penile prosthesis is noted. Prostate is mildly enlarged. Other: No abdominal wall hernia or abnormality. Retroperitoneal fluid is noted at the level the pancreas, asymmetric to the right. Musculoskeletal: Vertebral body heights alignment are maintained. Pelvis is normal. Degenerative changes are noted at the hips bilaterally. IMPRESSION: 1. Acute pancreatitis without complicating features. No pseudocyst or abscess. 2. No obstructing mass. 3. Retroperitoneal reactive fluid is asymmetric to the right. 4.  Aortic Atherosclerosis (ICD10-I70.0). 5. Hepatic steatosis. Electronically Signed   By: Marin Roberts M.D.   On: 06/01/2018 15:58   ASSESSMENT AND PLAN:  67 y.o. male with a known history of alcohol use admitted for acute pancreatitis  1. Acute pancreatitis- likely due to alcohol use. Lipase 1300. CT abdomen/pelvis did not show pseudocyst or abscess, also showed normal-appearing gallbladder and CBD. - Morphine for pain, Zofran for nausea - NPO for now, can advance diet as tolerated - continue IVFs - recheck lipase today and in am  2. Alcohol use- drinks 3-4 beers daily - CIWA - MVI, thiamine, folate  3. Elevated BP- does not have a history of HTN and not on any BP meds at home. Intermittently elevated BPs may be secondary to pain. - IV Hydralazine prn - Monitor  4. 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 Management plans discussed with the patient, nursing and they are in agreement.  CODE STATUS: Full Code  TOTAL TIME TAKING CARE OF THIS PATIENT: 35 minutes.   More than 50% of the time was spent in counseling/coordination of care: YES  POSSIBLE D/C IN 2-3 DAYS, DEPENDING ON CLINICAL CONDITION.   Delfino Lovett M.D on 06/02/2018 at 7:37 AM  Between 7am to 6pm - Pager - [phone]  After 6pm go to [email or web address] - Social research officer, government  [facility] [place] [facility]  Office  [phone]  CC: Primary care physician; Patient, No Pcp Per  Note: This dictation was prepared with Dragon dictation along with smaller phrase technology. Any transcriptional errors that result from this process are unintentional.

## 2018-06-02 NOTE — Plan of Care (Signed)
  Problem: Education: Goal: Knowledge of General Education information will improve Description Including pain rating scale, medication(s)/side effects and non-pharmacologic comfort measures Outcome: Progressing   

## 2018-06-02 NOTE — Plan of Care (Signed)
  Problem: Education: Goal: Knowledge of General Education information will improve Description: Including pain rating scale, medication(s)/side effects and non-pharmacologic comfort measures Outcome: Progressing   Problem: Health Behavior/Discharge Planning: Goal: Ability to manage health-related needs will improve Outcome: Progressing   Problem: Clinical Measurements: Goal: Ability to maintain clinical measurements within normal limits will improve Outcome: Progressing Goal: Diagnostic test results will improve Outcome: Progressing   Problem: Pain Managment: Goal: General experience of comfort will improve Outcome: Progressing   

## 2018-06-03 LAB — CBC
HCT: 49.5 % (ref 40.0–52.0)
Hemoglobin: 17 g/dL (ref 13.0–18.0)
MCH: 31.7 pg (ref 26.0–34.0)
MCHC: 34.3 g/dL (ref 32.0–36.0)
MCV: 92.4 fL (ref 80.0–100.0)
PLATELETS: 179 10*3/uL (ref 150–440)
RBC: 5.36 MIL/uL (ref 4.40–5.90)
RDW: 13.7 % (ref 11.5–14.5)
WBC: 9 10*3/uL (ref 3.8–10.6)

## 2018-06-03 LAB — COMPREHENSIVE METABOLIC PANEL
ALT: 15 U/L (ref 0–44)
AST: 17 U/L (ref 15–41)
Albumin: 3 g/dL — ABNORMAL LOW (ref 3.5–5.0)
Alkaline Phosphatase: 53 U/L (ref 38–126)
Anion gap: 8 (ref 5–15)
BILIRUBIN TOTAL: 0.7 mg/dL (ref 0.3–1.2)
BUN: 6 mg/dL — AB (ref 8–23)
CHLORIDE: 102 mmol/L (ref 98–111)
CO2: 29 mmol/L (ref 22–32)
CREATININE: 0.86 mg/dL (ref 0.61–1.24)
Calcium: 7.5 mg/dL — ABNORMAL LOW (ref 8.9–10.3)
GFR calc Af Amer: >60 mL/min (ref >60)
Glucose, Bld: 156 mg/dL — ABNORMAL HIGH (ref 70–99)
POTASSIUM: 3.4 mmol/L — AB (ref 3.5–5.1)
Sodium: 139 mmol/L (ref 135–145)
TOTAL PROTEIN: 6.4 g/dL — AB (ref 6.5–8.1)

## 2018-06-03 LAB — HIV ANTIBODY (ROUTINE TESTING W REFLEX): HIV Screen 4th Generation wRfx: NONREACTIVE

## 2018-06-03 LAB — LIPASE, BLOOD: Lipase: 104 U/L — ABNORMAL HIGH (ref 11–51)

## 2018-06-03 MED ORDER — MORPHINE SULFATE (PF) 2 MG/ML IV SOLN: 2.0000 mg | INTRAVENOUS | Status: DC | PRN

## 2018-06-03 MED ORDER — SENNOSIDES-DOCUSATE SODIUM 8.6-50 MG PO TABS
2.0000 | ORAL_TABLET | Freq: Two times a day (BID) | ORAL | Status: DC
  Administered 2018-06-03 – 2018-06-04 (×3): 2 via ORAL
  Filled 2018-06-03 (×3): qty 2

## 2018-06-03 MED ORDER — POTASSIUM CHLORIDE CRYS ER 20 MEQ PO TBCR
40.0000 meq | EXTENDED_RELEASE_TABLET | Freq: Once | ORAL | Status: AC
  Administered 2018-06-03: 40 meq via ORAL
  Filled 2018-06-03: qty 2

## 2018-06-03 NOTE — Progress Notes (Signed)
Patient tolerating clear liquid diet, no complaints. Ruben Johnson,Ruben Crable S, RN

## 2018-06-03 NOTE — Progress Notes (Signed)
[facility] - [place] at [facility]   PATIENT NAME: Ruben Johnson    MR#:  [id number]  DATE OF BIRTH:  03/02/1951  SUBJECTIVE:  CHIEF COMPLAINT:   Chief Complaint  Patient presents with  . Abdominal Pain  feels much better, pain 2/10, morphine helps. Would like to start CLD REVIEW OF SYSTEMS:  Review of Systems  Constitutional: Negative for chills, fever and weight loss.  HENT: Negative for nosebleeds and sore throat.   Eyes: Negative for blurred vision.  Respiratory: Negative for cough, shortness of breath and wheezing.   Cardiovascular: Negative for chest pain, orthopnea, leg swelling and PND.  Gastrointestinal: Positive for abdominal pain. Negative for constipation, diarrhea, heartburn, nausea and vomiting.  Genitourinary: Negative for dysuria and urgency.  Musculoskeletal: Negative for back pain.  Skin: Negative for rash.  Neurological: Negative for dizziness, speech change, focal weakness and headaches.  Endo/Heme/Allergies: Does not bruise/bleed easily.  Psychiatric/Behavioral: Negative for depression.   DRUG ALLERGIES:  No Known Allergies VITALS:  Blood pressure 134/84, pulse 85, temperature 98.8 F (37.1 C), temperature source Oral, resp. rate 18, height 6' (1.829 m), weight 87 kg (191 lb 14.4 oz), SpO2 97 %. PHYSICAL EXAMINATION:  Physical Exam  Constitutional: He is oriented to person, place, and time.  HENT:  Head: Normocephalic and atraumatic.  Eyes: Pupils are equal, round, and reactive to light. Conjunctivae and EOM are normal.  Neck: Normal range of motion. Neck supple. No tracheal deviation present. No thyromegaly present.  Cardiovascular: Normal rate, regular rhythm and normal heart sounds.  Pulmonary/Chest: Effort normal and breath sounds normal. No respiratory distress. He has no wheezes. He exhibits no tenderness.  Abdominal: Soft. Bowel sounds are normal. He exhibits distension. There is generalized tenderness.  Musculoskeletal:  Normal range of motion.  Neurological: He is alert and oriented to person, place, and time. No cranial nerve deficit.  Skin: Skin is warm and dry. No rash noted.   LABORATORY PANEL:  Male CBC Recent Labs  Lab 06/03/18 0343  WBC 9.0  HGB 17.0  HCT 49.5  PLT 179   ------------------------------------------------------------------------------------------------------------------ Chemistries  Recent Labs  Lab 06/03/18 0343  NA 139  K 3.4*  CL 102  CO2 29  GLUCOSE 156*  BUN 6*  CREATININE 0.86  CALCIUM 7.5*  AST 17  ALT 15  ALKPHOS 53  BILITOT 0.7   RADIOLOGY:  No results found. ASSESSMENT AND PLAN:  67 y.o. male with a known history of alcohol use admitted for acute pancreatitis  1. Acute pancreatitis- likely due to alcohol use. Lipase 1300->992->104. CT abdomen/pelvis did not show pseudocyst or abscess, also showed normal-appearing gallbladder and CBD. - cut back on Morphine, prn Zofran for nausea - start CLD, can advance diet as tolerated  2. Alcohol use- drinks 3-4 beers daily - CIWA - MVI, thiamine, folate  3. Elevated BP- does not have a history of HTN and not on any BP meds at home. Intermittently elevated BPs may be secondary to pain. - IV Hydralazine prn - Monitor  4. Hypokalemia: replete and recheck  5. 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 Management plans discussed with the patient, nursing and they are in agreement.  CODE STATUS: Full Code  TOTAL TIME TAKING CARE OF THIS PATIENT: 35 minutes.   More than 50% of the time was spent in  counseling/coordination of care: YES  POSSIBLE D/C IN 1-2 DAYS, DEPENDING ON CLINICAL CONDITION.   Delfino Lovett M.D on 06/03/2018 at 7:50 AM  Between 7am to 6pm - Pager - [phone]  After 6pm go to [email or web address] - Air traffic controller  [facility] [place] [facility]  Office  [phone]  CC: Primary care physician; Patient, No Pcp Per  Note: This dictation was prepared with Dragon dictation along with smaller phrase technology. Any transcriptional errors that result from this process are unintentional.

## 2018-06-04 LAB — BASIC METABOLIC PANEL
Anion gap: 8 (ref 5–15)
BUN: 11 mg/dL (ref 8–23)
CO2: 31 mmol/L (ref 22–32)
Calcium: 8 mg/dL — ABNORMAL LOW (ref 8.9–10.3)
Chloride: 102 mmol/L (ref 98–111)
Creatinine, Ser: 0.87 mg/dL (ref 0.61–1.24)
GFR calc non Af Amer: >60 mL/min (ref >60)
Glucose, Bld: 127 mg/dL — ABNORMAL HIGH (ref 70–99)
Potassium: 3.5 mmol/L (ref 3.5–5.1)
Sodium: 141 mmol/L (ref 135–145)

## 2018-06-04 LAB — CBC
HEMATOCRIT: 45.9 % (ref 40.0–52.0)
HEMOGLOBIN: 16.1 g/dL (ref 13.0–18.0)
MCH: 32.5 pg (ref 26.0–34.0)
MCHC: 35 g/dL (ref 32.0–36.0)
MCV: 92.7 fL (ref 80.0–100.0)
Platelets: 175 10*3/uL (ref 150–440)
RBC: 4.95 MIL/uL (ref 4.40–5.90)
RDW: 13.3 % (ref 11.5–14.5)
WBC: 10.3 10*3/uL (ref 3.8–10.6)

## 2018-06-04 LAB — LIPASE, BLOOD: Lipase: 30 U/L (ref 11–51)

## 2018-06-04 MED ORDER — BISACODYL 10 MG RE SUPP
10.0000 mg | Freq: Once | RECTAL | Status: DC
  Filled 2018-06-04: qty 1

## 2018-06-04 MED ORDER — ONDANSETRON HCL 4 MG PO TABS: 4.0000 mg | ORAL_TABLET | Freq: Four times a day (QID) | ORAL | 0 refills | Status: DC | PRN

## 2018-06-04 MED ORDER — SENNOSIDES-DOCUSATE SODIUM 8.6-50 MG PO TABS: 2.0000 | ORAL_TABLET | Freq: Two times a day (BID) | ORAL | 0 refills | Status: DC

## 2018-06-04 NOTE — Discharge Instructions (Signed)
It was so nice to meet you! I'm glad your stomach pain is getting better.  You came into the hospital because you had inflammation of your pancreas. This can happen due to alcohol use. I have prescribed two medications for you to use at home as needed: 1. Senokot-S (to help with constipation)- take two tablets twice a day as needed; 2. Zofran (nausea medication)- take 1 tablet by mouth every 6 hours as needed for nausea.

## 2018-06-04 NOTE — Care Management Important Message (Signed)
Important Message  Patient Details  Name: Aida PufferSherman J Ditmars MRN: [id number] Date of Birth: [birth year]   Medicare Important Message Given:  Yes    Olegario MessierKathy A Teralyn Mullins 06/04/2018, 10:53 AM

## 2018-06-04 NOTE — Plan of Care (Signed)
Pt  Tolerating advanced diet.  No nausea/pain.  Lipase improved to 30 today.  Removed IV, reviewed d/c instructions and made diet recommendations - low fat, reduced sugar, reduced alcohol intake.  Pt was in [employer] for over 30 years and [facility].  Daughter came to pick up patient.  Going home with PRN zofran and sennakot.

## 2018-06-04 NOTE — Discharge Summary (Signed)
[facility] - [place] at [facility]   PATIENT NAME: Ruben NielsenSherman Johnson    MR#:  [id number]  DATE OF BIRTH:  10/31/1951  DATE OF ADMISSION:  06/01/2018   ADMITTING PHYSICIAN: Campbell StallKaty Dodd Charod Slawinski, MD  DATE OF DISCHARGE: 06/04/2018  2:00 PM  PRIMARY CARE PHYSICIAN: Patient, No Pcp Per   ADMISSION DIAGNOSIS:  Epigastric pain [R10.13] Alcohol-induced acute pancreatitis without infection or necrosis [K85.20] DISCHARGE DIAGNOSIS:  Active Problems:   Acute pancreatitis  SECONDARY DIAGNOSIS:  History reviewed. No pertinent past medical history. HOSPITAL COURSE:  Ruben RockerSherman is a 67 year old male presenting to the ED with severe abdominal pain. Lipase was 1300 and CT abdomen pelvis showed acute pancreatitis without pseudocyst or abscess. He was made NPO and was given morphine for pain control. His pancreatitis was thought to be secondary to alcohol use. His abdominal pain improved and his diet was advanced slowly. He was able to tolerate a soft diet on the day of discharge.  DISCHARGE CONDITIONS:  Stable, improved CONSULTS OBTAINED:  none DRUG ALLERGIES:  No Known Allergies DISCHARGE MEDICATIONS:   Allergies as of 06/04/2018   No Known Allergies     Medication List    TAKE these medications   ondansetron 4 MG tablet Commonly known as:  ZOFRAN Take 1 tablet (4 mg total) by mouth every 6 (six) hours as needed for nausea.   senna-docusate 8.6-50 MG tablet Commonly known as:  Senokot-S Take 2 tablets by mouth 2 (two) times daily.        DISCHARGE INSTRUCTIONS:  1. F/u with PCP in 1-2 weeks 2. Continue to advance diet as tolerated DIET:  Soft diet DISCHARGE CONDITION:  Stable ACTIVITY:  Activity as tolerated OXYGEN:  Home Oxygen: No.  Oxygen Delivery: room air DISCHARGE LOCATION:  home   If you experience worsening of your admission symptoms, develop shortness of breath, life threatening emergency, suicidal or homicidal thoughts you must seek medical attention  immediately by calling 911 or calling your MD immediately  if symptoms less severe.  You Must read complete instructions/literature along with all the possible adverse reactions/side effects for all the Medicines you take and that have been prescribed to you. Take any new Medicines after you have completely understood and accpet all the possible adverse reactions/side effects.   Please note  You were cared for by a hospitalist during your hospital stay. If you have any questions about your discharge medications or the care you received while you were in the hospital after you are discharged, you can call the unit and asked to speak with the hospitalist on call if the hospitalist that took care of you is not available. Once you are discharged, your primary care physician will handle any further medical issues. Please note that NO REFILLS for any discharge medications will be authorized once you are discharged, as it is imperative that you return to your primary care physician (or establish a relationship with a primary care physician if you do not have one) for your aftercare needs so that they can reassess your need for medications and monitor your lab values.    On the day of Discharge:  VITAL SIGNS:  Blood pressure 132/67, pulse 73, temperature 98.1 F (36.7 C), temperature source Oral, resp. rate 18, height 6' (1.829 m), weight 87 kg (191 lb 14.4 oz), SpO2 93 %. PHYSICAL EXAMINATION:  GENERAL:  67 y.o.-year-old patient lying in the bed with no acute distress.  EYES: Pupils equal, round, reactive to light and  accommodation. No scleral icterus. Extraocular muscles intact.  HEENT: Head atraumatic, normocephalic. Oropharynx and nasopharynx clear.  NECK:  Supple, no jugular venous distention. No thyroid enlargement, no tenderness.  LUNGS: Normal breath sounds bilaterally, no wheezing, rales,rhonchi or crepitation. No use of accessory muscles of respiration.  CARDIOVASCULAR: S1, S2 normal. No  murmurs, rubs, or gallops.  ABDOMEN: Soft, non-tender, non-distended. Bowel sounds present. No organomegaly or mass.  EXTREMITIES: No pedal edema, cyanosis, or clubbing.  NEUROLOGIC: Cranial nerves II through XII are intact. Muscle strength 5/5 in all extremities. Sensation intact. Gait not checked.  PSYCHIATRIC: The patient is alert and oriented x 3.  SKIN: No obvious rash, lesion, or ulcer.  DATA REVIEW:   CBC Recent Labs  Lab 06/04/18 0413  WBC 10.3  HGB 16.1  HCT 45.9  PLT 175    Chemistries  Recent Labs  Lab 06/03/18 0343 06/04/18 0413  NA 139 141  K 3.4* 3.5  CL 102 102  CO2 29 31  GLUCOSE 156* 127*  BUN 6* 11  CREATININE 0.86 0.87  CALCIUM 7.5* 8.0*  AST 17  --   ALT 15  --   ALKPHOS 53  --   BILITOT 0.7  --      Microbiology Results  Results for orders placed or performed during the hospital encounter of 02/26/18  Chlamydia/NGC rt PCR ([facility] only)     Status: None   Collection Time: 02/26/18  6:58 AM  Result Value Ref Range Status   Specimen source GC/Chlam URINE, RANDOM  Final   Chlamydia Tr NOT DETECTED NOT DETECTED Final   N gonorrhoeae NOT DETECTED NOT DETECTED Final    Comment: (NOTE) 100  This methodology has not been evaluated in pregnant women or in 200  patients with a history of hysterectomy. 300 400  This methodology will not be performed on patients less than 19  years of age. Performed at [facility], [address] [place], [place] [address]   Urine Culture     Status: None   Collection Time: 02/26/18  7:10 AM  Result Value Ref Range Status   Specimen Description   Final    URINE, CLEAN CATCH Performed at [facility], [address] [place], [place] [address]    Special Requests   Final    NONE Performed at [facility], [address], [place], [place] [address]    Culture   Final    NO GROWTH Performed at [facility], [address], [place], [place] [address]    Report Status  02/27/2018 FINAL  Final    RADIOLOGY:  No results found.   Management plans discussed with the patient, family and they are in agreement.  CODE STATUS: Full Code   TOTAL TIME TAKING CARE OF THIS PATIENT: 35 minutes.    Jinny Blossom Blakleigh Straw M.D on 06/04/2018 at 4:11 PM  Between 7am to 6pm - Pager [phone]  After 6pm go to [email or web address] - Social research officer, government  [facility] [place] [facility]  Office  [phone]  CC: Primary care physician; Patient, No Pcp Per   Note: This dictation was prepared with Dragon dictation along with smaller phrase technology. Any transcriptional errors that result from this process are unintentional.

## 2018-06-07 ENCOUNTER — Telehealth: Payer: Self-pay

## 2018-06-07 NOTE — Telephone Encounter (Signed)
EMMI Follow-up: Noted on the report that the patient wasn't taking medications and no follow-up appointment.  I talked with Mr. Ruben Johnson and he said he has gotten his Rx's, there was no follow-up appointment needed and he was doing well. I let him know there would be a 2nd automated call with a different series of questions and to let us know if he had any needs at that time.

## 2020-05-13 ENCOUNTER — Other Ambulatory Visit: Payer: Self-pay

## 2020-05-13 ENCOUNTER — Inpatient Hospital Stay
Admission: EM | Admit: 2020-05-13 | Discharge: 2020-05-16 | DRG: 440 | Disposition: A | Discharge status: Home / Self Care | Payer: Medicare Other | Attending: Internal Medicine | Admitting: Internal Medicine

## 2020-05-13 ENCOUNTER — Emergency Department: Payer: Medicare Other

## 2020-05-13 ENCOUNTER — Encounter: Payer: Self-pay | Admitting: Emergency Medicine

## 2020-05-13 DIAGNOSIS — Z20822 Contact with and (suspected) exposure to covid-19: Secondary | ICD-10-CM | POA: Diagnosis present

## 2020-05-13 DIAGNOSIS — F1010 Alcohol abuse, uncomplicated: Secondary | ICD-10-CM | POA: Diagnosis present

## 2020-05-13 DIAGNOSIS — R739 Hyperglycemia, unspecified: Secondary | ICD-10-CM | POA: Diagnosis present

## 2020-05-13 DIAGNOSIS — Z23 Encounter for immunization: Secondary | ICD-10-CM | POA: Diagnosis present

## 2020-05-13 DIAGNOSIS — K859 Acute pancreatitis without necrosis or infection, unspecified: Secondary | ICD-10-CM

## 2020-05-13 DIAGNOSIS — Z87891 Personal history of nicotine dependence: Secondary | ICD-10-CM | POA: Diagnosis not present

## 2020-05-13 DIAGNOSIS — R1013 Epigastric pain: Secondary | ICD-10-CM | POA: Diagnosis present

## 2020-05-13 DIAGNOSIS — K852 Alcohol induced acute pancreatitis without necrosis or infection: Secondary | ICD-10-CM | POA: Diagnosis present

## 2020-05-13 DIAGNOSIS — E876 Hypokalemia: Secondary | ICD-10-CM | POA: Diagnosis not present

## 2020-05-13 HISTORY — DX: Acute pancreatitis without necrosis or infection, unspecified: K85.90

## 2020-05-13 LAB — URINALYSIS, COMPLETE (UACMP) WITH MICROSCOPIC
Bacteria, UA: NONE SEEN
Bilirubin Urine: NEGATIVE
Glucose, UA: NEGATIVE mg/dL
Hgb urine dipstick: NEGATIVE
Ketones, ur: 20 mg/dL — AB
Leukocytes,Ua: NEGATIVE
Nitrite: NEGATIVE
Protein, ur: NEGATIVE mg/dL
Specific Gravity, Urine: >1.046 — ABNORMAL HIGH (ref 1.005–1.030)
pH: 7 (ref 5.0–8.0)

## 2020-05-13 LAB — CBC
HCT: 45.9 % (ref 39.0–52.0)
Hemoglobin: 16 g/dL (ref 13.0–17.0)
MCH: 31.3 pg (ref 26.0–34.0)
MCHC: 34.9 g/dL (ref 30.0–36.0)
MCV: 89.8 fL (ref 80.0–100.0)
Platelets: 192 10*3/uL (ref 150–400)
RBC: 5.11 MIL/uL (ref 4.22–5.81)
RDW: 12.6 % (ref 11.5–15.5)
WBC: 5.3 10*3/uL (ref 4.0–10.5)
nRBC: 0 % (ref 0.0–0.2)

## 2020-05-13 LAB — COMPREHENSIVE METABOLIC PANEL
ALT: 36 U/L (ref 0–44)
AST: 27 U/L (ref 15–41)
Albumin: 3.5 g/dL (ref 3.5–5.0)
Alkaline Phosphatase: 51 U/L (ref 38–126)
Anion gap: 13 (ref 5–15)
BUN: 7 mg/dL — ABNORMAL LOW (ref 8–23)
CO2: 24 mmol/L (ref 22–32)
Calcium: 8.1 mg/dL — ABNORMAL LOW (ref 8.9–10.3)
Chloride: 105 mmol/L (ref 98–111)
Creatinine, Ser: 0.81 mg/dL (ref 0.61–1.24)
GFR calc Af Amer: >60 mL/min (ref >60)
GFR calc non Af Amer: >60 mL/min (ref >60)
Glucose, Bld: 148 mg/dL — ABNORMAL HIGH (ref 70–99)
Potassium: 3.6 mmol/L (ref 3.5–5.1)
Sodium: 142 mmol/L (ref 135–145)
Total Bilirubin: 0.8 mg/dL (ref 0.3–1.2)
Total Protein: 6.8 g/dL (ref 6.5–8.1)

## 2020-05-13 LAB — SARS CORONAVIRUS 2 BY RT PCR (HOSPITAL ORDER, PERFORMED IN ~~LOC~~ HOSPITAL LAB): SARS Coronavirus 2: NEGATIVE

## 2020-05-13 LAB — LIPASE, BLOOD: Lipase: 879 U/L — ABNORMAL HIGH (ref 11–51)

## 2020-05-13 MED ORDER — THIAMINE HCL 100 MG/ML IJ SOLN: 100.0000 mg | Freq: Every day | INTRAMUSCULAR | Status: DC

## 2020-05-13 MED ORDER — OXYCODONE HCL 5 MG PO TABS
5.0000 mg | ORAL_TABLET | ORAL | Status: DC | PRN
  Administered 2020-05-14: 5 mg via ORAL
  Filled 2020-05-13: qty 1

## 2020-05-13 MED ORDER — FOLIC ACID 1 MG PO TABS
1.0000 mg | ORAL_TABLET | Freq: Every day | ORAL | Status: DC
  Administered 2020-05-13 – 2020-05-16 (×4): 1 mg via ORAL
  Filled 2020-05-13 (×4): qty 1

## 2020-05-13 MED ORDER — ENOXAPARIN SODIUM 40 MG/0.4ML ~~LOC~~ SOLN
40.0000 mg | SUBCUTANEOUS | Status: DC
  Administered 2020-05-13 – 2020-05-15 (×2): 40 mg via SUBCUTANEOUS
  Filled 2020-05-13: qty 0.4

## 2020-05-13 MED ORDER — ONDANSETRON HCL 4 MG/2ML IJ SOLN: 4.0000 mg | Freq: Four times a day (QID) | INTRAMUSCULAR | Status: DC | PRN

## 2020-05-13 MED ORDER — LORAZEPAM 2 MG/ML IJ SOLN: 1.0000 mg | INTRAMUSCULAR | Status: DC | PRN

## 2020-05-13 MED ORDER — ACETAMINOPHEN 325 MG PO TABS: 650.0000 mg | ORAL_TABLET | ORAL | Status: DC | PRN

## 2020-05-13 MED ORDER — ONDANSETRON HCL 4 MG/2ML IJ SOLN
INTRAMUSCULAR | Status: AC
  Administered 2020-05-13: 4 mg via INTRAVENOUS
  Filled 2020-05-13: qty 2

## 2020-05-13 MED ORDER — ADULT MULTIVITAMIN W/MINERALS CH
1.0000 | ORAL_TABLET | Freq: Every day | ORAL | Status: DC
  Administered 2020-05-13 – 2020-05-16 (×4): 1 via ORAL
  Filled 2020-05-13 (×4): qty 1

## 2020-05-13 MED ORDER — MORPHINE SULFATE (PF) 2 MG/ML IV SOLN
1.0000 mg | INTRAVENOUS | Status: DC | PRN
  Administered 2020-05-13 (×2): 2 mg via INTRAVENOUS
  Administered 2020-05-14: 1 mg via INTRAVENOUS
  Administered 2020-05-14: 2 mg via INTRAVENOUS
  Filled 2020-05-13 (×4): qty 1

## 2020-05-13 MED ORDER — FENTANYL CITRATE (PF) 100 MCG/2ML IJ SOLN
50.0000 ug | Freq: Once | INTRAMUSCULAR | Status: AC
  Administered 2020-05-13: 50 ug via INTRAVENOUS
  Filled 2020-05-13: qty 2

## 2020-05-13 MED ORDER — SODIUM CHLORIDE 0.9 % IV BOLUS
1000.0000 mL | Freq: Once | INTRAVENOUS | Status: AC
  Administered 2020-05-13: 1000 mL via INTRAVENOUS

## 2020-05-13 MED ORDER — MORPHINE SULFATE (PF) 4 MG/ML IV SOLN
4.0000 mg | Freq: Once | INTRAVENOUS | Status: AC
  Administered 2020-05-13: 4 mg via INTRAVENOUS
  Filled 2020-05-13: qty 1

## 2020-05-13 MED ORDER — ONDANSETRON HCL 4 MG/2ML IJ SOLN
4.0000 mg | Freq: Once | INTRAMUSCULAR | Status: AC
  Administered 2020-05-13: 4 mg via INTRAVENOUS
  Filled 2020-05-13: qty 2

## 2020-05-13 MED ORDER — FENTANYL CITRATE (PF) 100 MCG/2ML IJ SOLN
INTRAMUSCULAR | Status: AC
  Administered 2020-05-13: 75 ug via INTRAVENOUS
  Filled 2020-05-13: qty 2

## 2020-05-13 MED ORDER — ONDANSETRON HCL 4 MG/2ML IJ SOLN
4.0000 mg | Freq: Once | INTRAMUSCULAR | Status: AC
Start: 2020-05-13 — End: 2020-05-13

## 2020-05-13 MED ORDER — IOHEXOL 300 MG/ML  SOLN
100.0000 mL | Freq: Once | INTRAMUSCULAR | Status: AC | PRN
  Administered 2020-05-13: 100 mL via INTRAVENOUS

## 2020-05-13 MED ORDER — PNEUMOCOCCAL VAC POLYVALENT 25 MCG/0.5ML IJ INJ
0.5000 mL | INJECTION | INTRAMUSCULAR | Status: AC
  Administered 2020-05-16: 0.5 mL via INTRAMUSCULAR
  Filled 2020-05-13: qty 0.5

## 2020-05-13 MED ORDER — FENTANYL CITRATE (PF) 100 MCG/2ML IJ SOLN: 75.0000 ug | Freq: Once | INTRAMUSCULAR | Status: AC

## 2020-05-13 MED ORDER — THIAMINE HCL 100 MG PO TABS
100.0000 mg | ORAL_TABLET | Freq: Every day | ORAL | Status: DC
  Administered 2020-05-13 – 2020-05-16 (×4): 100 mg via ORAL
  Filled 2020-05-13 (×4): qty 1

## 2020-05-13 MED ORDER — SODIUM CHLORIDE 0.9 % IV SOLN: INTRAVENOUS | Status: DC

## 2020-05-13 MED ORDER — LORAZEPAM 1 MG PO TABS: 1.0000 mg | ORAL_TABLET | ORAL | Status: DC | PRN

## 2020-05-13 NOTE — H&P (Signed)
History and Physical    Ruben Johnson FTD:[id number] DOB: [birth year] DOA: 05/13/2020  PCP: Patient, No Pcp Per  Patient coming from: Home.   I have personally briefly reviewed patient's old medical records in [facility]  Chief Complaint: severe pain in the belly since this afternoon.   HPI: Ruben Johnson is a 69 y.o. male with medical history significant of an episode of acute pancreatitis in 2019 presents to ED today with epigastric pain, associated with nausea, vomiting . He reports drinking alcohol, mostly beer every day and his last alcohol drink was on 05/11/20. He denies fever, chills, sob, chest pain, cough, palpitations, dizziness, headache, blurry vision,  diarrhea, bloody stools, hematuria, or dysuria. He denies hematemesis. He reports having vomited twice today.   ED Course: ona rrival to ED, he was afebrile, HR IS 74/MIN,normotensive, RR 14/min, labs reveal sodium of 142, potassium of 3.6, glucose of 148, BUN of 7, calcium of 8.1, lipase of 879. Cbc is unremarkable.  covid negative. EKG shows Sinus rhythm, Borderline right axis deviation . Borderline prolonged QT interval. CT abd and pelvis shows Severe acute uncomplicated pancreatitis. No pseudocyst, fluid collection, or abscess.  He was referred for [facility] admission for the management of acute pancreatitis.   Review of Systems: As per HPI otherwise All others reviewed and are negative,  Past Medical History:  Diagnosis Date  . Pancreatitis     History reviewed. No pertinent surgical history.  Social History  reports that he has quit smoking. He has never used smokeless tobacco. He reports that he does not drink alcohol and does not use drugs.  No Known Allergies  Family History:  No family history of Pancreatitis.   Prior to Admission medications   Not on File    Physical Exam: Vitals:   05/13/20 1530 05/13/20 1608 05/13/20 1609 05/13/20 1610  BP: 137/81     Pulse:  65 69 74  Resp: 18 14 14 14     Temp:      TempSrc:      SpO2:  94% 93% 96%  Weight:      Height:        Constitutional: mod distress from abdominal pain.  Vitals:   05/13/20 1530 05/13/20 1608 05/13/20 1609 05/13/20 1610  BP: 137/81     Pulse:  65 69 74  Resp: 18 14 14 14   Temp:      TempSrc:      SpO2:  94% 93% 96%  Weight:      Height:       Eyes: PERRL, lids and conjunctivae normal ENMT: Mucous membranes are dry.  Neck: normal, supple, Respiratory: clear to auscultation bilaterally, no wheezing, no crackles. Cardiovascular: Regular rate and rhythm, no murmurs  Abdomen: soft, tenderness in the mid abdomen and epigastric area, non distended.  Bowel sounds positive.  Musculoskeletal: no clubbing / cyanosis. No joint deformity upper and lower extremities.  Skin: no rashes, lesions, ulcers. No induration Neurologic: CN 2-12 grossly intact. No sensory or motor deficits.  Psychiatric: Normal judgment and insight. Alert and oriented x 3. Normal mood.    Labs on Admission: I have personally reviewed following labs and imaging studies  CBC: Recent Labs  Lab 05/13/20 1239  WBC 5.3  HGB 16.0  HCT 45.9  MCV 89.8  PLT 192    Basic Metabolic Panel: Recent Labs  Lab 05/13/20 1442  NA 142  K 3.6  CL 105  CO2 24  GLUCOSE 148*  BUN 7*  CREATININE 0.81  CALCIUM 8.1*    GFR: Estimated Creatinine Clearance: 94.5 mL/min (by C-G formula based on SCr of 0.81 mg/dL).  Liver Function Tests: Recent Labs  Lab 05/13/20 1442  AST 27  ALT 36  ALKPHOS 51  BILITOT 0.8  PROT 6.8  ALBUMIN 3.5    Urine analysis:    Component Value Date/Time   COLORURINE YELLOW (A) 02/26/2018 0705   APPEARANCEUR CLEAR (A) 02/26/2018 0705   LABSPEC 1.010 02/26/2018 0705   PHURINE 5.0 02/26/2018 0705   GLUCOSEU NEGATIVE 02/26/2018 0705   HGBUR NEGATIVE 02/26/2018 0705   BILIRUBINUR NEGATIVE 02/26/2018 0705   KETONESUR NEGATIVE 02/26/2018 0705   PROTEINUR NEGATIVE 02/26/2018 0705   NITRITE NEGATIVE 02/26/2018 0705    LEUKOCYTESUR MODERATE (A) 02/26/2018 0705    Radiological Exams on Admission: CT ABDOMEN PELVIS W CONTRAST  Result Date: 05/13/2020 CLINICAL DATA:  Epigastric pain, diaphoresis, clamminess EXAM: CT ABDOMEN AND PELVIS WITH CONTRAST TECHNIQUE: Multidetector CT imaging of the abdomen and pelvis was performed using the standard protocol following bolus administration of intravenous contrast. CONTRAST:  OMNIPAQUE IOHEXOL 300 MG/ML  SOLN COMPARISON:  06/01/2018 FINDINGS: Lower chest: No acute pleural or parenchymal lung disease. Scattered emphysema and subpleural scarring. Hepatobiliary: No focal liver abnormality is seen. No gallstones, gallbladder wall thickening, or biliary dilatation. Pancreas: There are marked inflammatory changes of the pancreas consistent with acute pancreatitis. Parenchymal edema is most pronounced within the head and body. There is diffuse peripancreatic fat stranding and free fluid. No fluid collection, abscess, or pseudocyst. Spleen: Normal in size without focal abnormality. Adrenals/Urinary Tract: Adrenal glands are unremarkable. Kidneys are normal, without renal calculi, focal lesion, or hydronephrosis. Bladder is unremarkable. Stomach/Bowel: No bowel obstruction or ileus. Normal appendix right lower quadrant. Wall thickening of the duodenal sweep is likely due to inflammatory changes from the adjacent pancreatitis. Vascular/Lymphatic: Aortic atherosclerosis. Splenic vein, superior mesenteric vein, and portal vein are patent. No enlarged abdominal or pelvic lymph nodes. Reproductive: Prostate is not enlarged. 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 Other: Free fluid throughout the upper abdomen. No free intraperitoneal gas. No abdominal wall hernia. Musculoskeletal: No acute or destructive bony lesions. Reconstructed images demonstrate no additional findings. IMPRESSION: 1. Severe acute uncomplicated pancreatitis. No pseudocyst, fluid collection, or abscess. 2. Aortic Atherosclerosis  (ICD10-I70.0) and Emphysema (ICD10-J43.9). Electronically Signed   By: Sharlet Salina M.D.   On: 05/13/2020 16:10    EKG: Independently reviewed. Sinus rhythm, Borderline right axis deviation . Borderline prolonged QT interval.  Assessment/Plan Active Problems:   Acute pancreatitis    Severe Acute Pancreatitis:  CT evidence of acute severe uncomplicated pancreatitis.  - admit for IV pain control, IV fluids , anti emetics. Unable to take anything oral at this time.  - check ethanol level.  - remain NPO.   Alcohol use disorder.  Start the patient on CIWA and monitor for withdrawal symptoms.  Alcohol cessation counseling.    Hyperglycemia:  Check A!c.    DVT prophylaxis: (Lovenox) Code Status:   Full Family Communication: none at bedside.  Disposition Plan:   Patient is from:  Home.   Anticipated DC to:  Home  Anticipated DC date:  05/15/20  Anticipated DC barriers: Resolution of acute pancreatitis, pain control.   Consults called:  None.  Admission status:  Inpatient/med surg.   Severity of Illness: The appropriate patient status for this patient is INPATIENT. Inpatient status is judged to be reasonable and necessary in order to provide the required intensity of service to ensure the patient's safety.  The patient's presenting symptoms, physical exam findings, and initial radiographic and laboratory data in the context of their chronic comorbidities is felt to place them at high risk for further clinical deterioration. Furthermore, it is not anticipated that the patient will be medically stable for discharge from the hospital within 2 midnights of admission.   * I certify that at the point of admission it is my clinical judgment that the patient will require inpatient hospital care spanning beyond 2 midnights from the point of admission due to high intensity of service, high risk for further deterioration and high frequency of surveillance required.*     Kathlen Mody  MD [facility]  How to contact the [facility] Attending or Consulting provider 7A - 7P or covering provider during after hours 7P -7A, for this patient?   1. Check the care team in [facility] and look for a) attending/consulting TRH provider listed and b) the [facility] team listed 2. Log into [email or web address] and use [place] universal password to access. If you do not have the password, please contact the hospital operator. 3. Locate the [facility] provider you are looking for under [facility] and page to a number that you can be directly reached. 4. If you still have difficulty reaching the provider, please page the [facility] (Director on Call) for the Hospitalists listed on amion for assistance.  05/13/2020, 5:07 PM

## 2020-05-13 NOTE — ED Provider Notes (Signed)
[facility] Emergency Department Provider Note   ____________________________________________   First MD Initiated Contact with Patient 05/13/20 1245     (approximate)  I have reviewed the triage vital signs and the nursing notes.   HISTORY  Chief Complaint Abdominal Pain    HPI Ruben Johnson is a 69 y.o. male here for evaluation of severe abdominal pain  Patient reports this morning started having sudden abdominal pain.  Nausea.  Vomiting.  No diarrhea.  No chest pain or trouble breathing.  No recent illness.  Does drink quite a bit of alcohol as he describes it, beer.  Has had very similar pain in the past due to "pancreatitis"  Has not been in the hospital or seen a doctor for a couple of years  No fevers or chills.  Positive for nausea and vomiting.  No black or bloody vomit.   sharp sudden severe mid to upper abdominal pain  Past Medical History:  Diagnosis Date  . Pancreatitis     Patient Active Problem List   Diagnosis Date Noted  . Acute pancreatitis 06/01/2018    History reviewed. No pertinent surgical history.  Prior to Admission medications   Medication Sig Start Date End Date Taking? Authorizing Provider  ondansetron (ZOFRAN) 4 MG tablet Take 1 tablet (4 mg total) by mouth every 6 (six) hours as needed for nausea. 06/04/18   Mayo, Allyn Kenner, MD  senna-docusate (SENOKOT-S) 8.6-50 MG tablet Take 2 tablets by mouth 2 (two) times daily. 06/04/18   Mayo, Allyn Kenner, MD    Allergies Patient has no known allergies.  History reviewed. No pertinent family history.  Social History Social History   Tobacco Use  . Smoking status: Former Games developer  . Smokeless tobacco: Never Used  Substance Use Topics  . Alcohol use: Never  . Drug use: Never    Review of Systems Constitutional: No fever/chills Eyes: No visual changes. ENT: No sore throat. Cardiovascular: Denies chest pain. Respiratory: Denies shortness of  breath. Gastrointestinal: See HPI Genitourinary: Negative for dysuria. Musculoskeletal: Negative for back pain. Skin: Sweaty with the pain Neurological: Negative for headaches.    ____________________________________________   PHYSICAL EXAM:  VITAL SIGNS: ED Triage Vitals  Enc Vitals Group     BP 05/13/20 1233 93/68     Pulse Rate 05/13/20 1233 71     Resp 05/13/20 1233 16     Temp 05/13/20 1233 98 F (36.7 C)     Temp Source 05/13/20 1233 Oral     SpO2 05/13/20 1233 98 %     Weight 05/13/20 1225 191 lb 12.8 oz (87 kg)     Height 05/13/20 1225 6' (1.829 m)     Head Circumference --      Peak Flow --      Pain Score 05/13/20 1224 10     Pain Loc --      Pain Edu? --      Excl. in GC? --     Constitutional: Alert and oriented.  Ill-appearing, laying flat diaphoretic.  Appears nauseated.  Then sits up and begins vomiting nonbloody nonbilious emesis eyes: Conjunctivae are normal. Head: Atraumatic. Nose: No congestion/rhinnorhea. Mouth/Throat: Mucous membranes are moist. Neck: No stridor.  Cardiovascular: Normal rate, regular rhythm. Grossly normal heart sounds.  Good peripheral circulation. Respiratory: Normal respiratory effort.  No retractions. Lungs CTAB. Gastrointestinal: Exquisitely tender across the epigastrium.  Moderate pain throughout on exam, but focally severe pain with evidence of peritonitis over the epigastrium and mid  abdomen Musculoskeletal: No lower extremity tenderness nor edema. Neurologic:  Normal speech and language. No gross focal neurologic deficits are appreciated.  Skin:  Skin is warm, dry and intact. No rash noted. Psychiatric: Mood and affect are normal. Speech and behavior are normal.  ____________________________________________   LABS (all labs ordered are listed, but only abnormal results are displayed)  Labs Reviewed  COMPREHENSIVE METABOLIC PANEL - Abnormal; Notable for the following components:      Result Value   Glucose, Bld 148  (*)    BUN 7 (*)    Calcium 8.1 (*)    All other components within normal limits  LIPASE, BLOOD - Abnormal; Notable for the following components:   Lipase 879 (*)    All other components within normal limits  SARS CORONAVIRUS 2 BY RT PCR (HOSPITAL ORDER, PERFORMED IN [facility])  CBC  URINALYSIS, COMPLETE (UACMP) WITH MICROSCOPIC   ____________________________________________  EKG  Reviewed interpreted at 1300 Heart rate 79 QRS 99 QTc 480 Mild prolongation of QT otherwise no acute findings denoted ____________________________________________  RADIOLOGY  CT ABDOMEN PELVIS W CONTRAST  Result Date: 05/13/2020 CLINICAL DATA:  Epigastric pain, diaphoresis, clamminess EXAM: CT ABDOMEN AND PELVIS WITH CONTRAST TECHNIQUE: Multidetector CT imaging of the abdomen and pelvis was performed using the standard protocol following bolus administration of intravenous contrast. CONTRAST:  OMNIPAQUE IOHEXOL 300 MG/ML  SOLN COMPARISON:  06/01/2018 FINDINGS: Lower chest: No acute pleural or parenchymal lung disease. Scattered emphysema and subpleural scarring. Hepatobiliary: No focal liver abnormality is seen. No gallstones, gallbladder wall thickening, or biliary dilatation. Pancreas: There are marked inflammatory changes of the pancreas consistent with acute pancreatitis. Parenchymal edema is most pronounced within the head and body. There is diffuse peripancreatic fat stranding and free fluid. No fluid collection, abscess, or pseudocyst. Spleen: Normal in size without focal abnormality. Adrenals/Urinary Tract: Adrenal glands are unremarkable. Kidneys are normal, without renal calculi, focal lesion, or hydronephrosis. Bladder is unremarkable. Stomach/Bowel: No bowel obstruction or ileus. Normal appendix right lower quadrant. Wall thickening of the duodenal sweep is likely due to inflammatory changes from the adjacent pancreatitis. Vascular/Lymphatic: Aortic atherosclerosis. Splenic vein,  superior mesenteric vein, and portal vein are patent. No enlarged abdominal or pelvic lymph nodes. Reproductive: Prostate is not enlarged. 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 Other: Free fluid throughout the upper abdomen. No free intraperitoneal gas. No abdominal wall hernia. Musculoskeletal: No acute or destructive bony lesions. Reconstructed images demonstrate no additional findings. IMPRESSION: 1. Severe acute uncomplicated pancreatitis. No pseudocyst, fluid collection, or abscess. 2. Aortic Atherosclerosis (ICD10-I70.0) and Emphysema (ICD10-J43.9). Electronically Signed   By: Sharlet Salina M.D.   On: 05/13/2020 16:10     CT abdomen pelvis reviewed, severe acute pancreatitis without noted complication ____________________________________________   PROCEDURES  Procedure(s) performed: None  Procedures  Critical Care performed: No  ____________________________________________   INITIAL IMPRESSION / ASSESSMENT AND PLAN / ED COURSE  Pertinent labs & imaging results that were available during my care of the patient were reviewed by me and considered in my medical decision making (see chart for details).   Differential diagnosis includes but is not limited to, abdominal perforation, aortic dissection, cholecystitis, appendicitis, diverticulitis, colitis, esophagitis/gastritis, kidney stone, pyelonephritis, urinary tract infection, aortic aneurysm. All are considered in decision and treatment plan. Based upon the patient's presentation and risk factors, proceed with imaging high suspicion for pancreatitis based on clinical history and evaluation   Clinical Course as of May 13 1645  Wed May 13, 2020  1307 Patient resting  much more comfortably now, reports he is feeling quite a bit better after pain medicine.  He does still appear quite fatigued but overall reports pain and nausea have improved.   [MQ]    Clinical Course User Index [MQ] Sharyn Creamer, MD     ----------------------------------------- 4:46 PM on 05/13/2020 -----------------------------------------  Patient reports pain starting to increase, his blood pressure improved he is fully awake and alert without distress on room air.  We will give additional morphine at this time for pain control, discussed results with the patient and patient agreeable with hospitalization with goals to control pain, treat pancreatitis, and develop treatment plan for his alcoholism.  Currently not experiencing any signs or symptoms of alcohol withdrawal.  Ruben Johnson was evaluated in Emergency Department on 05/13/2020 for the symptoms described in the history of present illness. He was evaluated in the context of the global COVID-19 pandemic, which necessitated consideration that the patient might be at risk for infection with the SARS-CoV-2 virus that causes COVID-19. Institutional protocols and algorithms that pertain to the evaluation of patients at risk for COVID-19 are in a state of rapid change based on information released by regulatory bodies including the CDC and federal and state organizations. These policies and algorithms were followed during the patient's care in the ED.  ____________________________________________   FINAL CLINICAL IMPRESSION(S) / ED DIAGNOSES  Final diagnoses:  Alcohol-induced acute pancreatitis without infection or necrosis        Note:  This document was prepared using Dragon voice recognition software and may include unintentional dictation errors       Sharyn Creamer, MD 05/13/20 1646

## 2020-05-13 NOTE — ED Triage Notes (Addendum)
Pt from home via [facility]. Reports this morning having sharp pain in lower abdomen. Clammy and diaphoretic. Denies N/V/D. States he thinks it is pancreatitis again.   100/50 HR 70 CBG 200 20 G L AC

## 2020-05-13 NOTE — Plan of Care (Signed)

## 2020-05-14 LAB — COMPREHENSIVE METABOLIC PANEL
ALT: 30 U/L (ref 0–44)
AST: 21 U/L (ref 15–41)
Albumin: 3.4 g/dL — ABNORMAL LOW (ref 3.5–5.0)
Alkaline Phosphatase: 49 U/L (ref 38–126)
Anion gap: 6 (ref 5–15)
BUN: 8 mg/dL (ref 8–23)
CO2: 30 mmol/L (ref 22–32)
Calcium: 8.4 mg/dL — ABNORMAL LOW (ref 8.9–10.3)
Chloride: 104 mmol/L (ref 98–111)
Creatinine, Ser: 0.78 mg/dL (ref 0.61–1.24)
GFR calc Af Amer: >60 mL/min (ref >60)
GFR calc non Af Amer: >60 mL/min (ref >60)
Glucose, Bld: 155 mg/dL — ABNORMAL HIGH (ref 70–99)
Potassium: 4 mmol/L (ref 3.5–5.1)
Sodium: 140 mmol/L (ref 135–145)
Total Bilirubin: 0.7 mg/dL (ref 0.3–1.2)
Total Protein: 6.7 g/dL (ref 6.5–8.1)

## 2020-05-14 LAB — CBC
HCT: 44.8 % (ref 39.0–52.0)
Hemoglobin: 15.4 g/dL (ref 13.0–17.0)
MCH: 31.6 pg (ref 26.0–34.0)
MCHC: 34.4 g/dL (ref 30.0–36.0)
MCV: 92 fL (ref 80.0–100.0)
Platelets: 206 10*3/uL (ref 150–400)
RBC: 4.87 MIL/uL (ref 4.22–5.81)
RDW: 12.6 % (ref 11.5–15.5)
WBC: 4.8 10*3/uL (ref 4.0–10.5)
nRBC: 0 % (ref 0.0–0.2)

## 2020-05-14 LAB — HIV ANTIBODY (ROUTINE TESTING W REFLEX): HIV Screen 4th Generation wRfx: NONREACTIVE

## 2020-05-14 LAB — LIPASE, BLOOD: Lipase: 575 U/L — ABNORMAL HIGH (ref 11–51)

## 2020-05-14 LAB — ETHANOL: Alcohol, Ethyl (B): <10 mg/dL (ref <10)

## 2020-05-14 LAB — MAGNESIUM: Magnesium: 2.1 mg/dL (ref 1.7–2.4)

## 2020-05-14 LAB — PHOSPHORUS: Phosphorus: 3.5 mg/dL (ref 2.5–4.6)

## 2020-05-14 NOTE — Progress Notes (Signed)
PROGRESS NOTE    Ruben Johnson  IRJ:[id number] DOB: [birth year] DOA: 05/13/2020 PCP: Patient, No Pcp Per   Chief Complaint  Patient presents with  . Abdominal Pain    Brief Narrative:  Ruben Johnson is a 69 y.o. male with medical history significant of an episode of acute pancreatitis in 2019 presents to ED  On 7/14/ 21 with epigastric pain, associated with nausea, vomiting . He reports drinking alcohol, mostly beer every day and his last alcohol drink was on 05/11/20. CT abd and pelvis shows Severe acute uncomplicated pancreatitis. No pseudocyst, fluid collection, or abscess. He was admitted for management of acute pancreatitis.  Assessment & Plan:   Active Problems:   Acute pancreatitis   Alcohol induced pancreatitis.  Improving abd pain, nausea and vomiting but not resolved yet.  Resume IV fluids, NPO, IV pain control.  Lipid panel ordered to check TG.    Alcohol use disorder: No signs of withdrawal at this time.  Continue to monitor on CIWA.     Elevated BP  Pt reports not having hypertension.  Elevated BP probably from abd pain.     DVT prophylaxis:  Lovenox.  Code Status: FULL CODE. Family Communication: none at bedside.  Disposition:   Status is: Inpatient  Remains inpatient appropriate because:IV treatments appropriate due to intensity of illness or inability to take PO   Dispo: The patient is from: Home              Anticipated d/c is to: Home              Anticipated d/c date is: 1 day              Patient currently is not medically stable to d/c.       Consultants:   None.   Procedures:none.  Antimicrobials: none.   Subjective: pain improved from yesterday but not resolved. Nauseated.   Objective: Vitals:   05/13/20 2203 05/14/20 0016 05/14/20 0630 05/14/20 1204  BP: (!) 155/74 (!) 150/72 (!) 146/78 (!) 145/74  Pulse: 64 66 60 62  Resp: 18 18 18 16   Temp: 98.3 F (36.8 C) 97.9 F (36.6 C) 97.9 F (36.6 C) 98.3 F (36.8 C)    TempSrc: Oral Oral Oral Oral  SpO2: 97% 95% 99% 100%  Weight:      Height:        Intake/Output Summary (Last 24 hours) at 05/14/2020 1352 Last data filed at 05/14/2020 0519 Gross per 24 hour  Intake 1275.48 ml  Output --  Net 1275.48 ml   Filed Weights   05/13/20 1225  Weight: 87 kg    Examination:  General exam: mild distress from abd pain.  Respiratory system: Clear to auscultation. Respiratory effort normal. Cardiovascular system: S1 & S2 heard, RRR. No JVD, No pedal edema. Gastrointestinal system: Abdomen is soft, generalized tenderness, bowel sounds wnl.  Central nervous system: Alert and oriented. No focal neurological deficits. Extremities: Symmetric 5 x 5 power. Skin: No rashes, lesions or ulcers Psychiatry: Mood & affect appropriate.     Data Reviewed: I have personally reviewed following labs and imaging studies  CBC: Recent Labs  Lab 05/13/20 1239 05/14/20 0335  WBC 5.3 4.8  HGB 16.0 15.4  HCT 45.9 44.8  MCV 89.8 92.0  PLT 192 206    Basic Metabolic Panel: Recent Labs  Lab 05/13/20 1442 05/14/20 0335  NA 142 140  K 3.6 4.0  CL 105 104  CO2 24 30  GLUCOSE 148*  155*  BUN 7* 8  CREATININE 0.81 0.78  CALCIUM 8.1* 8.4*  MG  --  2.1  PHOS  --  3.5    GFR: Estimated Creatinine Clearance: 95.7 mL/min (by C-G formula based on SCr of 0.78 mg/dL).  Liver Function Tests: Recent Labs  Lab 05/13/20 1442 05/14/20 0335  AST 27 21  ALT 36 30  ALKPHOS 51 49  BILITOT 0.8 0.7  PROT 6.8 6.7  ALBUMIN 3.5 3.4*    CBG: No results for input(s): GLUCAP in the last 168 hours.   Recent Results (from the past 240 hour(s))  SARS Coronavirus 2 by RT PCR (hospital order, performed in [facility] hospital lab) Nasopharyngeal Nasopharyngeal Swab     Status: None   Collection Time: 05/13/20  2:42 PM   Specimen: Nasopharyngeal Swab  Result Value Ref Range Status   SARS Coronavirus 2 NEGATIVE NEGATIVE Final    Comment: (NOTE) SARS-CoV-2 target nucleic  acids are NOT DETECTED.  The SARS-CoV-2 RNA is generally detectable in upper and lower respiratory specimens during the acute phase of infection. The lowest concentration of SARS-CoV-2 viral copies this assay can detect is 250 copies / mL. A negative result does not preclude SARS-CoV-2 infection and should not be used as the sole basis for treatment or other patient management decisions.  A negative result may occur with improper specimen collection / handling, submission of specimen other than nasopharyngeal swab, presence of viral mutation(s) within the areas targeted by this assay, and inadequate number of viral copies (<250 copies / mL). A negative result must be combined with clinical observations, patient history, and epidemiological information.  Fact Sheet for Patients:   BoilerBrush.com.cy  Fact Sheet for Healthcare Providers: https://pope.com/  This test is not yet approved or  cleared by the [place] FDA and has been authorized for detection and/or diagnosis of SARS-CoV-2 by FDA under an Emergency Use Authorization (EUA).  This EUA will remain in effect (meaning this test can be used) for the duration of the COVID-19 declaration under Section 564(b)(1) of the Act, 21 U.S.C. section 360bbb-3(b)(1), unless the authorization is terminated or revoked sooner.  Performed at [facility], [address] [place], [place] [address]          Radiology Studies: CT ABDOMEN PELVIS W CONTRAST  Result Date: 05/13/2020 CLINICAL DATA:  Epigastric pain, diaphoresis, clamminess EXAM: CT ABDOMEN AND PELVIS WITH CONTRAST TECHNIQUE: Multidetector CT imaging of the abdomen and pelvis was performed using the standard protocol following bolus administration of intravenous contrast. CONTRAST:  OMNIPAQUE IOHEXOL 300 MG/ML  SOLN COMPARISON:  06/01/2018 FINDINGS: Lower chest: No acute pleural or parenchymal lung disease.  Scattered emphysema and subpleural scarring. Hepatobiliary: No focal liver abnormality is seen. No gallstones, gallbladder wall thickening, or biliary dilatation. Pancreas: There are marked inflammatory changes of the pancreas consistent with acute pancreatitis. Parenchymal edema is most pronounced within the head and body. There is diffuse peripancreatic fat stranding and free fluid. No fluid collection, abscess, or pseudocyst. Spleen: Normal in size without focal abnormality. Adrenals/Urinary Tract: Adrenal glands are unremarkable. Kidneys are normal, without renal calculi, focal lesion, or hydronephrosis. Bladder is unremarkable. Stomach/Bowel: No bowel obstruction or ileus. Normal appendix right lower quadrant. Wall thickening of the duodenal sweep is likely due to inflammatory changes from the adjacent pancreatitis. Vascular/Lymphatic: Aortic atherosclerosis. Splenic vein, superior mesenteric vein, and portal vein are patent. No enlarged abdominal or pelvic lymph nodes. Reproductive: Prostate is not enlarged. 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 Other: Free fluid throughout  the upper abdomen. No free intraperitoneal gas. No abdominal wall hernia. Musculoskeletal: No acute or destructive bony lesions. Reconstructed images demonstrate no additional findings. IMPRESSION: 1. Severe acute uncomplicated pancreatitis. No pseudocyst, fluid collection, or abscess. 2. Aortic Atherosclerosis (ICD10-I70.0) and Emphysema (ICD10-J43.9). Electronically Signed   By: Sharlet Salina M.D.   On: 05/13/2020 16:10        Scheduled Meds: . enoxaparin (LOVENOX) injection  40 mg Subcutaneous Q24H  . folic acid  1 mg Oral Daily  . multivitamin with minerals  1 tablet Oral Daily  . pneumococcal 23 valent vaccine  0.5 mL Intramuscular Tomorrow-1000  . thiamine  100 mg Oral Daily   Or  . thiamine  100 mg Intravenous Daily   Continuous Infusions: . sodium chloride 125 mL/hr at 05/14/20 1122     LOS: 1 day       Kathlen Mody, MD [facility]   To contact the attending provider between 7A-7P or the covering provider during after hours 7P-7A, please log into the web site [email or web address] and access using universal [place] password for that web site. If you do not have the password, please call the hospital operator.  05/14/2020, 1:52 PM

## 2020-05-14 NOTE — TOC Transition Note (Signed)
Transition of Care (TOC) - CM/SW Discharge Note   Patient Details  Name: Ruben Johnson MRN: [id number] Date of Birth: 03/22/1951  Transition of Care (TOC) CM/SW Contact:  Deliliah J Gregory, RN Phone Number: 05/14/2020, 2:02 PM   Clinical Narrative:    Met with the patient in the room to discuss DC plan and needs He lives at home alone He is independent at home, I provided him with resources for Substance abuse He drives [facility] He stated that he does not have any needs   Final next level of care: Home/Self Care Barriers to Discharge: Continued Medical Work up   Patient Goals and CMS Choice Patient states their goals for this hospitalization and ongoing recovery are:: go home      Discharge Placement                       Discharge Plan and Services   Discharge Planning Services: CM Consult            DME Arranged: N/A         HH Arranged: NA          Social Determinants of Health (SDOH) Interventions     Readmission Risk Interventions No flowsheet data found.     

## 2020-05-15 LAB — BASIC METABOLIC PANEL
Anion gap: 11 (ref 5–15)
BUN: 7 mg/dL — ABNORMAL LOW (ref 8–23)
CO2: 26 mmol/L (ref 22–32)
Calcium: 8.2 mg/dL — ABNORMAL LOW (ref 8.9–10.3)
Chloride: 103 mmol/L (ref 98–111)
Creatinine, Ser: 0.92 mg/dL (ref 0.61–1.24)
GFR calc Af Amer: >60 mL/min (ref >60)
GFR calc non Af Amer: >60 mL/min (ref >60)
Glucose, Bld: 106 mg/dL — ABNORMAL HIGH (ref 70–99)
Potassium: 3.2 mmol/L — ABNORMAL LOW (ref 3.5–5.1)
Sodium: 140 mmol/L (ref 135–145)

## 2020-05-15 LAB — LIPID PANEL
Cholesterol: 217 mg/dL — ABNORMAL HIGH (ref 0–200)
HDL: 56 mg/dL (ref >40)
LDL Cholesterol: 144 mg/dL — ABNORMAL HIGH (ref 0–99)
Total CHOL/HDL Ratio: 3.9 RATIO
Triglycerides: 84 mg/dL (ref <150)
VLDL: 17 mg/dL (ref 0–40)

## 2020-05-15 LAB — LIPASE, BLOOD: Lipase: 139 U/L — ABNORMAL HIGH (ref 11–51)

## 2020-05-15 MED ORDER — POTASSIUM CHLORIDE CRYS ER 20 MEQ PO TBCR
40.0000 meq | EXTENDED_RELEASE_TABLET | Freq: Two times a day (BID) | ORAL | Status: DC
  Administered 2020-05-15 – 2020-05-16 (×3): 40 meq via ORAL
  Filled 2020-05-15 (×3): qty 2

## 2020-05-15 NOTE — Progress Notes (Signed)
PROGRESS NOTE    SHIVA SAHAGIAN  VHQ:[id number] DOB: 1951-05-31 DOA: 05/13/2020 PCP: Patient, No Pcp Per   Chief Complaint  Patient presents with  . Abdominal Pain    Brief Narrative:  CESARIO WEIDINGER is a 69 y.o. male with medical history significant of an episode of acute pancreatitis in 2019 presents to ED  On 7/14/ 21 with epigastric pain, associated with nausea, vomiting . He reports drinking alcohol, mostly beer every day and his last alcohol drink was on 05/11/20. CT abd and pelvis shows Severe acute uncomplicated pancreatitis. No pseudocyst, fluid collection, or abscess. He was admitted for management of acute pancreatitis.  Pt seen and examined, symptoms are improving. Started on clears today.  Assessment & Plan:   Active Problems:   Acute pancreatitis   Alcohol induced pancreatitis.  Improving abd pain, nausea , but abd pain hasn't completely resolved.  Starting him on clears today.  Decrease the rate of  IV fluids, IV pain control.     Alcohol use disorder: No signs of withdrawal at this time.  Continue to monitor on CIWA.     Elevated BP  Pt reports not having hypertension.  Elevated BP probably from abd pain.     DVT prophylaxis:  Lovenox.  Code Status: FULL CODE. Family Communication: none at bedside.  Disposition:   Status is: Inpatient  Remains inpatient appropriate because:IV treatments appropriate due to intensity of illness or inability to take PO   Dispo: The patient is from: Home              Anticipated d/c is to: Home              Anticipated d/c date is: 1 day              Patient currently is not medically stable to d/c.       Consultants:   None.   Procedures:none.  Antimicrobials: none.   Subjective: abd pain improved, no nausea or vomiting. Or diarrhea.   Objective: Vitals:   05/14/20 1204 05/14/20 1810 05/15/20 0017 05/15/20 0607  BP: (!) 145/74 (!) 143/65 124/68 133/66  Pulse: 62 (!) 57 66 64  Resp: 16 16 16 14     Temp: 98.3 F (36.8 C) 98.4 F (36.9 C) 98.2 F (36.8 C) 98.6 F (37 C)  TempSrc: Oral Oral  Oral  SpO2: 100% 98% 99% 99%  Weight:      Height:        Intake/Output Summary (Last 24 hours) at 05/15/2020 1124 Last data filed at 05/15/2020 0925 Gross per 24 hour  Intake 380 ml  Output 1725 ml  Net -1345 ml   Filed Weights   05/13/20 1225  Weight: 87 kg    Examination:  General exam: alert and comfortable.  Respiratory system: clear to auscultation, no wheezing or rhonchi.  Cardiovascular system: S1-S2 heard, regular rate rhythm, no JVD, no pedal edema Gastrointestinal system: Abdomen is soft, tenderness has improved, bowel sounds normal Central nervous system: Alert and oriented, grossly nonfocal Extremities: No pedal edema Skin: No rashes seen Psychiatry: Mood is appropriate    Data Reviewed: I have personally reviewed following labs and imaging studies  CBC: Recent Labs  Lab 05/13/20 1239 05/14/20 0335  WBC 5.3 4.8  HGB 16.0 15.4  HCT 45.9 44.8  MCV 89.8 92.0  PLT 192 206    Basic Metabolic Panel: Recent Labs  Lab 05/13/20 1442 05/14/20 0335 05/15/20 0327  NA 142 140 140  K 3.6  4.0 3.2*  CL 105 104 103  CO2 24 30 26   GLUCOSE 148* 155* 106*  BUN 7* 8 7*  CREATININE 0.81 0.78 0.92  CALCIUM 8.1* 8.4* 8.2*  MG  --  2.1  --   PHOS  --  3.5  --     GFR: Estimated Creatinine Clearance: 83.2 mL/min (by C-G formula based on SCr of 0.92 mg/dL).  Liver Function Tests: Recent Labs  Lab 05/13/20 1442 05/14/20 0335  AST 27 21  ALT 36 30  ALKPHOS 51 49  BILITOT 0.8 0.7  PROT 6.8 6.7  ALBUMIN 3.5 3.4*    CBG: No results for input(s): GLUCAP in the last 168 hours.   Recent Results (from the past 240 hour(s))  SARS Coronavirus 2 by RT PCR (hospital order, performed in [facility] hospital lab) Nasopharyngeal Nasopharyngeal Swab     Status: None   Collection Time: 05/13/20  2:42 PM   Specimen: Nasopharyngeal Swab  Result Value Ref Range Status    SARS Coronavirus 2 NEGATIVE NEGATIVE Final    Comment: (NOTE) SARS-CoV-2 target nucleic acids are NOT DETECTED.  The SARS-CoV-2 RNA is generally detectable in upper and lower respiratory specimens during the acute phase of infection. The lowest concentration of SARS-CoV-2 viral copies this assay can detect is 250 copies / mL. A negative result does not preclude SARS-CoV-2 infection and should not be used as the sole basis for treatment or other patient management decisions.  A negative result may occur with improper specimen collection / handling, submission of specimen other than nasopharyngeal swab, presence of viral mutation(s) within the areas targeted by this assay, and inadequate number of viral copies (<250 copies / mL). A negative result must be combined with clinical observations, patient history, and epidemiological information.  Fact Sheet for Patients:   05/15/20  Fact Sheet for Healthcare Providers: BoilerBrush.com.cy  This test is not yet approved or  cleared by the https://pope.com/ FDA and has been authorized for detection and/or diagnosis of SARS-CoV-2 by FDA under an Emergency Use Authorization (EUA).  This EUA will remain in effect (meaning this test can be used) for the duration of the COVID-19 declaration under Section 564(b)(1) of the Act, 21 U.S.C. section 360bbb-3(b)(1), unless the authorization is terminated or revoked sooner.  Performed at [facility], [address] [place], [place]          Radiology Studies: CT ABDOMEN PELVIS W CONTRAST  Result Date: 05/13/2020 CLINICAL DATA:  Epigastric pain, diaphoresis, clamminess EXAM: CT ABDOMEN AND PELVIS WITH CONTRAST TECHNIQUE: Multidetector CT imaging of the abdomen and pelvis was performed using the standard protocol following bolus administration of intravenous contrast. CONTRAST:  05/15/2020 OMNIPAQUE IOHEXOL 300 MG/ML  SOLN  COMPARISON:  06/01/2018 FINDINGS: Lower chest: No acute pleural or parenchymal lung disease. Scattered emphysema and subpleural scarring. Hepatobiliary: No focal liver abnormality is seen. No gallstones, gallbladder wall thickening, or biliary dilatation. Pancreas: There are marked inflammatory changes of the pancreas consistent with acute pancreatitis. Parenchymal edema is most pronounced within the head and body. There is diffuse peripancreatic fat stranding and free fluid. No fluid collection, abscess, or pseudocyst. Spleen: Normal in size without focal abnormality. Adrenals/Urinary Tract: Adrenal glands are unremarkable. Kidneys are normal, without renal calculi, focal lesion, or hydronephrosis. Bladder is unremarkable. Stomach/Bowel: No bowel obstruction or ileus. Normal appendix right lower quadrant. Wall thickening of the duodenal sweep is likely due to inflammatory changes from the adjacent pancreatitis. Vascular/Lymphatic: Aortic atherosclerosis. Splenic vein, superior mesenteric vein,  and portal vein are patent. No enlarged abdominal or pelvic lymph nodes. Reproductive: Prostate is not enlarged. 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 Other: Free fluid throughout the upper abdomen. No free intraperitoneal gas. No abdominal wall hernia. Musculoskeletal: No acute or destructive bony lesions. Reconstructed images demonstrate no additional findings. IMPRESSION: 1. Severe acute uncomplicated pancreatitis. No pseudocyst, fluid collection, or abscess. 2. Aortic Atherosclerosis (ICD10-I70.0) and Emphysema (ICD10-J43.9). Electronically Signed   By: Sharlet Salina M.D.   On: 05/13/2020 16:10        Scheduled Meds: . enoxaparin (LOVENOX) injection  40 mg Subcutaneous Q24H  . folic acid  1 mg Oral Daily  . multivitamin with minerals  1 tablet Oral Daily  . pneumococcal 23 valent vaccine  0.5 mL Intramuscular Tomorrow-1000  . potassium chloride  40 mEq Oral BID  . thiamine  100 mg Oral Daily   Or  . thiamine   100 mg Intravenous Daily   Continuous Infusions: . sodium chloride 125 mL/hr at 05/15/20 0311     LOS: 2 days       Kathlen Mody, MD [facility]   To contact the attending provider between 7A-7P or the covering provider during after hours 7P-7A, please log into the web site [email or web address] and access using universal [place] password for that web site. If you do not have the password, please call the hospital operator.  05/15/2020, 11:24 AM

## 2020-05-15 NOTE — Care Management Important Message (Signed)
Important Message  Patient Details  Name: Ruben Johnson MRN: [id number] Date of Birth: Mar 02, 1951   Medicare Important Message Given:  N/A - LOS <3 / Initial given by admissions     Olegario Messier A Janann Boeve 05/15/2020, 7:45 AM

## 2020-05-16 MED ORDER — ADULT MULTIVITAMIN W/MINERALS CH: 1.0000 | ORAL_TABLET | Freq: Every day | ORAL | Status: DC

## 2020-05-16 MED ORDER — THIAMINE HCL 100 MG PO TABS: 100.0000 mg | ORAL_TABLET | Freq: Every day | ORAL | Status: DC

## 2020-05-16 MED ORDER — FOLIC ACID 1 MG PO TABS: 1.0000 mg | ORAL_TABLET | Freq: Every day | ORAL | Status: DC

## 2020-05-16 NOTE — Discharge Summary (Signed)
Physician Discharge Summary  Ruben Johnson QMV:[id number] DOB: 1951-05-16 DOA: 05/13/2020  PCP: Patient, No Pcp Per  Admit date: 05/13/2020 Discharge date: 05/16/2020  Admitted From: Home.  Disposition:  Home.   Recommendations for Outpatient Follow-up:  1. Follow up with PCP in 1-2 weeks 2. Please obtain BMP/CBC in one week  Discharge Condition:stable.  CODE STATUS: full code.  Diet recommendation: Heart Healthy  Brief/Interim Summary:  Ruben Johnson a 69 y.o.malewith medical history significant ofan episode of acute pancreatitis in 2019 presents to ED  On 7/14/ 21 with epigastric pain, associated with nausea, vomiting . He reports drinking alcohol, mostly beer every day and his last alcohol drink was on 05/11/20. CT abd and pelvis showsSevere acute uncomplicated pancreatitis. No pseudocyst, fluid collection, or abscess. He was admitted for management of acute pancreatitis.  Pt seen and examined, abd pain, nausea, vomiting have resolved. Able to tolerate soft diet,   Discharge Diagnoses:  Active Problems:   Acute pancreatitis   Alcohol induced pancreatitis.  Appear to have resolved.  No nausea, vomiting or abd pain.      Alcohol use disorder: No signs of withdrawal at this time.  Cessation counseling given and pt is agreeable.   Hypokalemia: replaced.   Elevated BP  Pt reports not having hypertension.  Recommend outpatient follow up with PCp for initiation of anti hypertensives.  Discharge Instructions  Discharge Instructions    Diet - low sodium heart healthy   Complete by: As directed    Discharge instructions   Complete by: As directed    Please Refrain from taking alcohol.  Please follow up with PCP in one week.     Allergies as of 05/16/2020   No Known Allergies     Medication List    TAKE these medications   folic acid 1 MG tablet Commonly known as: FOLVITE Take 1 tablet (1 mg total) by mouth daily.   multivitamin with minerals  Tabs tablet Take 1 tablet by mouth daily.   thiamine 100 MG tablet Take 1 tablet (100 mg total) by mouth daily.       No Known Allergies  Consultations:  None.    Procedures/Studies: CT ABDOMEN PELVIS W CONTRAST  Result Date: 05/13/2020 CLINICAL DATA:  Epigastric pain, diaphoresis, clamminess EXAM: CT ABDOMEN AND PELVIS WITH CONTRAST TECHNIQUE: Multidetector CT imaging of the abdomen and pelvis was performed using the standard protocol following bolus administration of intravenous contrast. CONTRAST:  OMNIPAQUE IOHEXOL 300 MG/ML  SOLN COMPARISON:  06/01/2018 FINDINGS: Lower chest: No acute pleural or parenchymal lung disease. Scattered emphysema and subpleural scarring. Hepatobiliary: No focal liver abnormality is seen. No gallstones, gallbladder wall thickening, or biliary dilatation. Pancreas: There are marked inflammatory changes of the pancreas consistent with acute pancreatitis. Parenchymal edema is most pronounced within the head and body. There is diffuse peripancreatic fat stranding and free fluid. No fluid collection, abscess, or pseudocyst. Spleen: Normal in size without focal abnormality. Adrenals/Urinary Tract: Adrenal glands are unremarkable. Kidneys are normal, without renal calculi, focal lesion, or hydronephrosis. Bladder is unremarkable. Stomach/Bowel: No bowel obstruction or ileus. Normal appendix right lower quadrant. Wall thickening of the duodenal sweep is likely due to inflammatory changes from the adjacent pancreatitis. Vascular/Lymphatic: Aortic atherosclerosis. Splenic vein, superior mesenteric vein, and portal vein are patent. No enlarged abdominal or pelvic lymph nodes. Reproductive: Prostate is not enlarged. 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 Other: Free fluid throughout the upper abdomen. No free intraperitoneal gas. No abdominal wall hernia. Musculoskeletal: No acute or destructive  bony lesions. Reconstructed images demonstrate no additional findings. IMPRESSION:  1. Severe acute uncomplicated pancreatitis. No pseudocyst, fluid collection, or abscess. 2. Aortic Atherosclerosis (ICD10-I70.0) and Emphysema (ICD10-J43.9). Electronically Signed   By: Sharlet Salina M.D.   On: 05/13/2020 16:10     Subjective:  No abd pain or chest pain.  Discharge Exam: Vitals:   05/15/20 2317 05/16/20 0600  BP: (!) 160/69 (!) 145/85  Pulse: (!) 59 61  Resp: 17 16  Temp: 98.6 F (37 C) 98.5 F (36.9 C)  SpO2: 97% 96%   Vitals:   05/15/20 1208 05/15/20 1811 05/15/20 2317 05/16/20 0600  BP: 137/69 138/71 (!) 160/69 (!) 145/85  Pulse: 61 62 (!) 59 61  Resp: 16 16 17 16   Temp: 98.8 F (37.1 C) 98.8 F (37.1 C) 98.6 F (37 C) 98.5 F (36.9 C)  TempSrc: Oral Oral Oral Oral  SpO2: 99% 97% 97% 96%  Weight:      Height:        General: Pt is alert, awake, not in acute distress Cardiovascular: RRR, S1/S2 +, no rubs, no gallops Respiratory: CTA bilaterally, no wheezing, no rhonchi Abdominal: Soft, NT, ND, bowel sounds + Extremities: no edema, no cyanosis    The results of significant diagnostics from this hospitalization (including imaging, microbiology, ancillary and laboratory) are listed below for reference.     Microbiology: Recent Results (from the past 240 hour(s))  SARS Coronavirus 2 by RT PCR (hospital order, performed in [facility] hospital lab) Nasopharyngeal Nasopharyngeal Swab     Status: None   Collection Time: 05/13/20  2:42 PM   Specimen: Nasopharyngeal Swab  Result Value Ref Range Status   SARS Coronavirus 2 NEGATIVE NEGATIVE Final    Comment: (NOTE) SARS-CoV-2 target nucleic acids are NOT DETECTED.  The SARS-CoV-2 RNA is generally detectable in upper and lower respiratory specimens during the acute phase of infection. The lowest concentration of SARS-CoV-2 viral copies this assay can detect is 250 copies / mL. A negative result does not preclude SARS-CoV-2 infection and should not be used as the sole basis for treatment or  other patient management decisions.  A negative result may occur with improper specimen collection / handling, submission of specimen other than nasopharyngeal swab, presence of viral mutation(s) within the areas targeted by this assay, and inadequate number of viral copies (<250 copies / mL). A negative result must be combined with clinical observations, patient history, and epidemiological information.  Fact Sheet for Patients:   05/15/20  Fact Sheet for Healthcare Providers: BoilerBrush.com.cy  This test is not yet approved or  cleared by the https://pope.com/ FDA and has been authorized for detection and/or diagnosis of SARS-CoV-2 by FDA under an Emergency Use Authorization (EUA).  This EUA will remain in effect (meaning this test can be used) for the duration of the COVID-19 declaration under Section 564(b)(1) of the Act, 21 U.S.C. section 360bbb-3(b)(1), unless the authorization is terminated or revoked sooner.  Performed at [facility], [address] [place], [place]      Labs: BNP (last 3 results) No results for input(s): BNP in the last 8760 hours. Basic Metabolic Panel: Recent Labs  Lab 05/13/20 1442 05/14/20 0335 05/15/20 0327  NA 142 140 140  K 3.6 4.0 3.2*  CL 105 104 103  CO2 24 30 26   GLUCOSE 148* 155* 106*  BUN 7* 8 7*  CREATININE 0.81 0.78 0.92  CALCIUM 8.1* 8.4* 8.2*  MG  --  2.1  --  PHOS  --  3.5  --    Liver Function Tests: Recent Labs  Lab 05/13/20 1442 05/14/20 0335  AST 27 21  ALT 36 30  ALKPHOS 51 49  BILITOT 0.8 0.7  PROT 6.8 6.7  ALBUMIN 3.5 3.4*   Recent Labs  Lab 05/13/20 1442 05/14/20 0335 05/15/20 0327  LIPASE 879* 575* 139*   No results for input(s): AMMONIA in the last 168 hours. CBC: Recent Labs  Lab 05/13/20 1239 05/14/20 0335  WBC 5.3 4.8  HGB 16.0 15.4  HCT 45.9 44.8  MCV 89.8 92.0  PLT 192 206   Cardiac Enzymes: No results for  input(s): CKTOTAL, CKMB, CKMBINDEX, TROPONINI in the last 168 hours. BNP: Invalid input(s): POCBNP CBG: No results for input(s): GLUCAP in the last 168 hours. D-Dimer No results for input(s): DDIMER in the last 72 hours. Hgb A1c No results for input(s): HGBA1C in the last 72 hours. Lipid Profile Recent Labs    05/15/20 0327  CHOL 217*  HDL 56  LDLCALC 144*  TRIG 84  CHOLHDL 3.9   Thyroid function studies No results for input(s): TSH, T4TOTAL, T3FREE, THYROIDAB in the last 72 hours.  Invalid input(s): FREET3 Anemia work up No results for input(s): VITAMINB12, FOLATE, FERRITIN, TIBC, IRON, RETICCTPCT in the last 72 hours. Urinalysis    Component Value Date/Time   COLORURINE YELLOW (A) 05/13/2020 1442   APPEARANCEUR CLEAR (A) 05/13/2020 1442   LABSPEC >1.046 (H) 05/13/2020 1442   PHURINE 7.0 05/13/2020 1442   GLUCOSEU NEGATIVE 05/13/2020 1442   HGBUR NEGATIVE 05/13/2020 1442   BILIRUBINUR NEGATIVE 05/13/2020 1442   KETONESUR 20 (A) 05/13/2020 1442   PROTEINUR NEGATIVE 05/13/2020 1442   NITRITE NEGATIVE 05/13/2020 1442   LEUKOCYTESUR NEGATIVE 05/13/2020 1442   Sepsis Labs Invalid input(s): PROCALCITONIN,  WBC,  LACTICIDVEN Microbiology Recent Results (from the past 240 hour(s))  SARS Coronavirus 2 by RT PCR (hospital order, performed in [facility] hospital lab) Nasopharyngeal Nasopharyngeal Swab     Status: None   Collection Time: 05/13/20  2:42 PM   Specimen: Nasopharyngeal Swab  Result Value Ref Range Status   SARS Coronavirus 2 NEGATIVE NEGATIVE Final    Comment: (NOTE) SARS-CoV-2 target nucleic acids are NOT DETECTED.  The SARS-CoV-2 RNA is generally detectable in upper and lower respiratory specimens during the acute phase of infection. The lowest concentration of SARS-CoV-2 viral copies this assay can detect is 250 copies / mL. A negative result does not preclude SARS-CoV-2 infection and should not be used as the sole basis for treatment or other patient  management decisions.  A negative result may occur with improper specimen collection / handling, submission of specimen other than nasopharyngeal swab, presence of viral mutation(s) within the areas targeted by this assay, and inadequate number of viral copies (<250 copies / mL). A negative result must be combined with clinical observations, patient history, and epidemiological information.  Fact Sheet for Patients:   BoilerBrush.com.cy  Fact Sheet for Healthcare Providers: https://pope.com/  This test is not yet approved or  cleared by the [place] FDA and has been authorized for detection and/or diagnosis of SARS-CoV-2 by FDA under an Emergency Use Authorization (EUA).  This EUA will remain in effect (meaning this test can be used) for the duration of the COVID-19 declaration under Section 564(b)(1) of the Act, 21 U.S.C. section 360bbb-3(b)(1), unless the authorization is terminated or revoked sooner.  Performed at [facility], [address], [place], [place] [address]  Time coordinating discharge: 32 minutes.   SIGNED:   Kathlen ModyVijaya Alaine Loughney, MD  [facility] 05/16/2020, 9:12 AM

## 2020-05-16 NOTE — Plan of Care (Signed)

## 2020-08-19 DIAGNOSIS — R030 Elevated blood-pressure reading, without diagnosis of hypertension: Secondary | ICD-10-CM | POA: Insufficient documentation

## 2020-08-19 DIAGNOSIS — M25519 Pain in unspecified shoulder: Secondary | ICD-10-CM | POA: Insufficient documentation

## 2020-08-19 DIAGNOSIS — E785 Hyperlipidemia, unspecified: Secondary | ICD-10-CM | POA: Insufficient documentation

## 2020-08-19 DIAGNOSIS — L989 Disorder of the skin and subcutaneous tissue, unspecified: Secondary | ICD-10-CM | POA: Insufficient documentation

## 2020-08-19 DIAGNOSIS — R209 Unspecified disturbances of skin sensation: Secondary | ICD-10-CM | POA: Insufficient documentation

## 2020-08-19 DIAGNOSIS — R519 Headache, unspecified: Secondary | ICD-10-CM | POA: Insufficient documentation

## 2021-12-13 ENCOUNTER — Emergency Department: Payer: Medicare Other

## 2021-12-13 ENCOUNTER — Other Ambulatory Visit: Payer: Self-pay

## 2021-12-13 ENCOUNTER — Emergency Department
Admission: EM | Admit: 2021-12-13 | Discharge: 2021-12-13 | Disposition: A | Discharge status: Home / Self Care | Payer: Medicare Other | Attending: Emergency Medicine | Admitting: Emergency Medicine

## 2021-12-13 DIAGNOSIS — N5089 Other specified disorders of the male genital organs: Secondary | ICD-10-CM

## 2021-12-13 DIAGNOSIS — N451 Epididymitis: Secondary | ICD-10-CM | POA: Insufficient documentation

## 2021-12-13 DIAGNOSIS — K859 Acute pancreatitis without necrosis or infection, unspecified: Secondary | ICD-10-CM | POA: Diagnosis not present

## 2021-12-13 DIAGNOSIS — N3090 Cystitis, unspecified without hematuria: Secondary | ICD-10-CM

## 2021-12-13 DIAGNOSIS — N50812 Left testicular pain: Secondary | ICD-10-CM | POA: Diagnosis present

## 2021-12-13 LAB — CBC WITH DIFFERENTIAL/PLATELET
Abs Immature Granulocytes: 0.03 10*3/uL (ref 0.00–0.07)
Basophils Absolute: 0 10*3/uL (ref 0.0–0.1)
Basophils Relative: 0 %
Eosinophils Absolute: 0 10*3/uL (ref 0.0–0.5)
Eosinophils Relative: 0 %
HCT: 47.1 % (ref 39.0–52.0)
Hemoglobin: 15.6 g/dL (ref 13.0–17.0)
Immature Granulocytes: 0 %
Lymphocytes Relative: 13 %
Lymphs Abs: 1.2 10*3/uL (ref 0.7–4.0)
MCH: 31 pg (ref 26.0–34.0)
MCHC: 33.1 g/dL (ref 30.0–36.0)
MCV: 93.5 fL (ref 80.0–100.0)
Monocytes Absolute: 1.2 10*3/uL — ABNORMAL HIGH (ref 0.1–1.0)
Monocytes Relative: 13 %
Neutro Abs: 6.8 10*3/uL (ref 1.7–7.7)
Neutrophils Relative %: 74 %
Platelets: 214 10*3/uL (ref 150–400)
RBC: 5.04 MIL/uL (ref 4.22–5.81)
RDW: 12.9 % (ref 11.5–15.5)
WBC: 9.2 10*3/uL (ref 4.0–10.5)
nRBC: 0 % (ref 0.0–0.2)

## 2021-12-13 LAB — URINALYSIS, ROUTINE W REFLEX MICROSCOPIC
Bacteria, UA: NONE SEEN
Bilirubin Urine: NEGATIVE
Glucose, UA: NEGATIVE mg/dL
Ketones, ur: 5 mg/dL — AB
Nitrite: NEGATIVE
Protein, ur: NEGATIVE mg/dL
Specific Gravity, Urine: >1.046 — ABNORMAL HIGH (ref 1.005–1.030)
WBC, UA: >50 WBC/hpf — ABNORMAL HIGH (ref 0–5)
pH: 5 (ref 5.0–8.0)

## 2021-12-13 LAB — BASIC METABOLIC PANEL
Anion gap: 10 (ref 5–15)
BUN: 14 mg/dL (ref 8–23)
CO2: 29 mmol/L (ref 22–32)
Calcium: 8.9 mg/dL (ref 8.9–10.3)
Chloride: 99 mmol/L (ref 98–111)
Creatinine, Ser: 0.81 mg/dL (ref 0.61–1.24)
GFR, Estimated: >60 mL/min (ref >60)
Glucose, Bld: 116 mg/dL — ABNORMAL HIGH (ref 70–99)
Potassium: 4.1 mmol/L (ref 3.5–5.1)
Sodium: 138 mmol/L (ref 135–145)

## 2021-12-13 MED ORDER — IOHEXOL 300 MG/ML  SOLN
80.0000 mL | Freq: Once | INTRAMUSCULAR | Status: AC | PRN
  Administered 2021-12-13: 80 mL via INTRAVENOUS

## 2021-12-13 MED ORDER — SULFAMETHOXAZOLE-TRIMETHOPRIM 800-160 MG PO TABS: 1.0000 | ORAL_TABLET | Freq: Two times a day (BID) | ORAL | 0 refills | Status: DC

## 2021-12-13 MED ORDER — SULFAMETHOXAZOLE-TRIMETHOPRIM 800-160 MG PO TABS
1.0000 | ORAL_TABLET | Freq: Once | ORAL | Status: AC
  Administered 2021-12-13: 1 via ORAL
  Filled 2021-12-13: qty 1

## 2021-12-13 NOTE — ED Notes (Signed)
This RN checked to see if pt had given a urine specimen yet; pt states he has not been able to void yet. This RN let pt know that urine is the only thing we are waiting on for his disposition, so if he could give [place] just a little bit, that's all we need. Pt v/u & agrees with plan.

## 2021-12-13 NOTE — Discharge Instructions (Addendum)
Your CT scan does not show any signs of a hernia.  Your symptoms appear to be due to inflammation of the backside of the testicle.  Take antibiotics as prescribed, and follow-up with urology for further evaluation.

## 2021-12-13 NOTE — ED Notes (Signed)
Pt transported to CT via stretcher with CT tech. °

## 2021-12-13 NOTE — ED Notes (Signed)
This RN attempted to obtain PIV access & blood work x2 without success.

## 2021-12-13 NOTE — ED Notes (Signed)
Pt c/o possible hernia. Hx of hernia in the past. Pt denies pain at this time. Denies N/V. Pt states he is having regular BM's, last BM Saturday (2 days ago).

## 2021-12-13 NOTE — ED Provider Notes (Signed)
[facility] Provider Note    Event Date/Time   First MD Initiated Contact with Patient 12/13/21 1521     (approximate)   History   Left testicle pain  HPI  Ruben Johnson is a 71 y.o. male with a past history of bilateral inguinal hernia repair 30 years ago who comes ED complaining of left sided testicle and scrotum pain and swelling.  No difficulty with urination.  No dysuria.  No fevers or chills or diarrhea.  No constipation.  No vomiting.  Eating and drinking normally.  Feels like there is swelling in the area that is worse with standing, better lying down.     Physical Exam   Triage Vital Signs: ED Triage Vitals  Enc Vitals Group     BP 12/13/21 1023 (!) 142/100     Pulse Rate 12/13/21 1023 (!) 103     Resp 12/13/21 1023 20     Temp 12/13/21 1023 98.5 F (36.9 C)     Temp src --      SpO2 12/13/21 1023 95 %     Weight 12/13/21 1024 180 lb (81.6 kg)     Height 12/13/21 1024 6' (1.829 m)     Head Circumference --      Peak Flow --      Pain Score 12/13/21 1023 10     Pain Loc --      Pain Edu? --      Excl. in GC? --     Most recent vital signs: Vitals:   12/13/21 1023 12/13/21 1538  BP: (!) 142/100 (!) 150/67  Pulse: (!) 103 89  Resp: 20 17  Temp: 98.5 F (36.9 C)   SpO2: 95% 96%     General: Awake, no distress.  CV:  Good peripheral perfusion.  Resp:  Normal effort.  Abd:  No distention.  Soft and nontender. Other:  Genital exam reveals left scrotal swelling with tenderness at the epididymis.  There is a firm masslike structure as well that is tender.  Inguinal canal is empty, no lymphadenopathy.   ED Results / Procedures / Treatments   Labs (all labs ordered are listed, but only abnormal results are displayed) Labs Reviewed  BASIC METABOLIC PANEL - Abnormal; Notable for the following components:      Result Value   Glucose, Bld 116 (*)    All other components within normal limits  CBC WITH DIFFERENTIAL/PLATELET -  Abnormal; Notable for the following components:   Monocytes Absolute 1.2 (*)    All other components within normal limits  URINE CULTURE  URINALYSIS, ROUTINE W REFLEX MICROSCOPIC     EKG     RADIOLOGY Ultrasound scrotum viewed and interpreted by me, shows evidence of epididymitis.  Possible small left inguinal hernia visualized.  Radiology report reviewed.    PROCEDURES:  Critical Care performed: No  Procedures   MEDICATIONS ORDERED IN ED: Medications  sulfamethoxazole-trimethoprim (BACTRIM DS) 800-160 MG per tablet 1 tablet (has no administration in time range)  iohexol (OMNIPAQUE) 300 MG/ML solution 80 mL (80 mLs Intravenous Contrast Given 12/13/21 1756)     IMPRESSION / MDM / ASSESSMENT AND PLAN / ED COURSE  I reviewed the triage vital signs and the nursing notes.                              Differential diagnosis includes, but is not limited to, epididymitis, incarcerated hernia, cystitis  Patient  with left scrotal pain and swelling.  Ultrasound and exam consistent with epididymitis.  However, scrotal swelling/mass seems unusually large for epididymis tissue, suspect inguinal hernia.  Will obtain CT scan.  ----------------------------------------- 6:26 PM on 12/13/2021 ----------------------------------------- CT negative for hernia.  I considered is confirmatory for epididymitis and will start antibiotics.  Will attempt to obtain urine sample for culture.   Clinical Course as of 12/13/21 2007  Mon Dec 13, 2021  2006 Urinalysis consistent with cystitis.  I doubt pyelonephritis or sepsis. [PS]    Clinical Course User Index [PS] Sharman Cheek, MD     FINAL CLINICAL IMPRESSION(S) / ED DIAGNOSES   Final diagnoses:  Epididymitis     Rx / DC Orders   ED Discharge Orders          Ordered    sulfamethoxazole-trimethoprim (BACTRIM DS) 800-160 MG tablet  2 times daily        12/13/21 1829             Note:  This document was prepared using  Dragon voice recognition software and may include unintentional dictation errors.   Sharman Cheek, MD 12/13/21 [phone]

## 2021-12-13 NOTE — ED Notes (Signed)
Pt transported back to pt room from CT via stretcher with CT tech. °

## 2021-12-13 NOTE — ED Notes (Signed)
Urine specimen sent to lab

## 2021-12-13 NOTE — ED Notes (Signed)
This RN asked Judeth Cornfield, RN to assist with obtaining labs & PIV access.

## 2021-12-13 NOTE — ED Triage Notes (Signed)
Pt reports possible hernia per pt, states has not been to doctor or had hernia confirmed but knows has one. Pt reports left sided swelling to scrotum noticed Friday. Denies difficulty urinating. Denies n/v

## 2021-12-14 NOTE — Progress Notes (Signed)
12/15/21 4:34 PM   Ruben Johnson [birth year] [id number]  Referring provider:  No referring provider defined for this encounter. Chief Complaint  Patient presents with   Testicle Pain     HPI: Ruben Johnson is a 71 y.o.male who presents today for further evaluation of left scrotal pain and swelling.   He was seen in the ED on 12/13/2021 for left sided testicle and scrotum pain and swelling. Urinalysis showed small Hgb, moderate leukocytes, and >50 WBCs. Scrotal ultrasound visualized No evidence of testicular mass or torsion. Enlarged and hypervascular left epididymis is noted concerning for epididymitis. Small bilateral hydroceles are noted. Possible left inguinal hernia is noted which may be extending into the scrotum. CT abdomen and pelvis visualized mild prostatomegaly, bladder wall thickening.   He reports that he had scrotal pain from incident at work.  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  He had enlargement in the whole scrotal sac. He denies any burning frequency. He is currently taking antibiotics in the form of Bactrim.  He reports he was post to take another antibiotic but could not afford this 1.  He is an interesting historian today and perseverates about his history of bilateral inguinal hernia repair and how he thinks that its bowel in his scrotum despite having no evidence of this on CT scan.  He does have tenderness per report in his left inguinal canal.  He reports most of his care is at the [facility].  It was not until exam today at which time a penile prosthesis was identified did he mention that he had a penile prosthesis placed at the [facility].   PMH: Past Medical History:  Diagnosis Date   Pancreatitis     Surgical History: No past surgical history on file.  Home Medications:  Allergies as of 12/15/2021   No Known Allergies      Medication List        Accurate as  of December 15, 2021  4:34 PM. If you have any questions, ask your nurse or doctor.          STOP taking these medications    folic acid 1 MG tablet Commonly known as: FOLVITE Stopped by: Vanna Scotland, MD   thiamine 100 MG tablet Stopped by: Vanna Scotland, MD       TAKE these medications    multivitamin with minerals Tabs tablet Take 1 tablet by mouth daily.   sulfamethoxazole-trimethoprim 800-160 MG tablet Commonly known as: Bactrim DS Take 1 tablet by mouth 2 (two) times daily for 10 days.        Allergies: No Known Allergies  Family History: No family history on file.  Social History:  reports that he has quit smoking. He has never used smokeless tobacco. He reports that he does not drink alcohol and does not use drugs.   Physical Exam: BP (!) 146/84    Pulse 69    Ht 6\' 1"  (1.854 m)    Wt 190 lb (86.2 kg)    BMI 25.07 kg/m   Constitutional:  Alert and oriented, No acute distress. HEENT: [place] AT, moist mucus membranes.  Trachea midline, no masses. Cardiovascular: No clubbing, cyanosis, or edema. Respiratory: Normal respiratory effort, no increased work of breathing. prothesis pump identified situated in the mid, more to the right-sided scrotum.  There is no fluctuance, erythema around the pump itself.  The left  testicle firm enlarged 8 cm with no overlying skin changes.  There is some tenderness in the left inguinal canal, minimal.  Cord on the side is also mildly thickened.  Tubing is not palpated within the canal. Skin: No rashes, bruises or suspicious lesions. Neurologic: Grossly intact, no focal deficits, moving all 4 extremities. Psychiatric: Normal mood and affect.  Laboratory Data: Lab Results  Component Value Date   CREATININE 0.81 12/13/2021    Urinalysis 11-30 wbcs   Pertinent Imaging: CLINICAL DATA:  Acute left scrotal pain and swelling.  ULTRASOUND OF THE TESTICLES   TECHNIQUE: Complete ultrasound examination of the testicles,  epididymis, and other scrotal structures was performed. Color and spectral Doppler ultrasound were also utilized to evaluate blood flow to the testicles.   COMPARISON:  May 13, 2020.   FINDINGS: Right testicle   Measurements: 3.4 x 2.2 x 2.0 cm. No mass or microlithiasis visualized.   Left testicle   Measurements: 2.9 x 2.4 x 1.7 cm. No mass or microlithiasis visualized.   Right epididymis:  Normal in size and appearance.   Left epididymis: Enlarged and hypervascular concerning for epididymitis.   Hydrocele:  Small bilateral hydroceles are noted.   Varicocele:  None visualized.   Pulsed Doppler interrogation of both testes demonstrates normal low resistance arterial and venous waveforms bilaterally.   Possible left inguinal hernia is noted which may be extending into the scrotum.   IMPRESSION: No evidence of testicular mass or torsion. Enlarged and hypervascular left epididymis is noted concerning for epididymitis. Small bilateral hydroceles are noted. Possible left inguinal hernia is noted which may be extending into the scrotum; CT scan may be performed for further evaluation.     Electronically Signed   By: Lupita Raider M.D.   On: 12/13/2021 11:48   CLINICAL DATA:  Hernia per patient.  Left-sided scrotal swelling.   EXAM: CT ABDOMEN AND PELVIS WITH CONTRAST   TECHNIQUE: Multidetector CT imaging of the abdomen and pelvis was performed using the standard protocol following bolus administration of intravenous contrast.   RADIATION DOSE REDUCTION: This exam was performed according to the departmental dose-optimization program which includes automated exposure control, adjustment of the mA and/or kV according to patient size and/or use of iterative reconstruction technique.   CONTRAST:  39mL OMNIPAQUE IOHEXOL 300 MG/ML  SOLN   COMPARISON:  Today's scrotal ultrasound. 05/13/2020 abdominopelvic CT.   FINDINGS: Lower chest: Emphysema. Normal heart size  without pericardial or pleural effusion. Lad and right coronary artery calcification.   Hepatobiliary: Normal liver. Normal gallbladder, without biliary ductal dilatation.   Pancreas: Mild pancreatic atrophy involving the body tail. Subtle parenchymal calcifications within the head.   No duct dilatation or acute inflammation.   Spleen: Normal in size, without focal abnormality.   Adrenals/Urinary Tract: Normal right adrenal gland. Mild left adrenal thickening. Interpolar left renal 11 mm cyst. Normal right kidney. The bladder appears thick walled, but is underdistended.   Stomach/Bowel: Normal stomach, without wall thickening. Normal colon, appendix, and terminal ileum. Normal small bowel.   Vascular/Lymphatic: Advanced aortic and branch vessel atherosclerosis. Small abdominal retroperitoneal nodes, none pathologic by size criteria. No pelvic sidewall adenopathy.   Reproductive: Mild prostatomegaly. Penile pump. Prominent left-sided scrotal vessels could represent varicocele or be related to hyperemia given epididymitis on ultrasound.   Other: No evidence of inguinal hernia.  No significant free fluid.   Musculoskeletal: No acute osseous abnormality.   IMPRESSION: 1. No evidence of hernia, including inguinal hernia. 2.  No acute process in  the abdomen or pelvis. 3. Aortic atherosclerosis (ICD10-I70.0), coronary artery atherosclerosis and emphysema (ICD10-J43.9). 4. Mild prostatomegaly. Bladder wall thickening could be due to underdistention or a component of outlet obstruction. Cystitis cannot be excluded. 5. Mild chronic calcific pancreatitis.  No acute inflammation.     Electronically Signed   By: Jeronimo Greaves M.D.   On: 12/13/2021 18:18   CT scan was personally reviewed.  The reservoir is in the left space of [place] without any surrounding fluid collection or hyperemia.  Scrotal pump itself does appear to be situated more on the right side with the tubing traversing  more medial to the left inguinal canal.  There is no evidence of pump infection on imaging.  Assessment & Plan:    Left scrotal pain  -Left epididymal orchitis complicated by the presence of penile prosthesis, high risk for development of infected device with GU infection but at this point in time based on exam and imaging, does not appear to be acutely infected.  As such, we will continue to manage conservatively but with extremely close follow-up. -We had a very lengthy conversation about the extreme importance of close follow-up and warning symptoms including overlying skin changes, tenderness of the pump, fevers, penile discharge or swelling, etc. all of which may indicate an infected pump.  At that time, he should present to the emergency room.  Otherwise, the importance of extremely close follow-up was discussed. - continue with bactrim for scrotal infection will scrotal recheck on Friday - given the patient's ability to give a cohesive history to rule out STI  2.  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  I have reviewed the above documentation for accuracy and completeness, and I agree with the above.   Vanna Scotland, MD   [facility] [address] [place], [place] [address] [phone]

## 2021-12-15 ENCOUNTER — Ambulatory Visit (INDEPENDENT_AMBULATORY_CARE_PROVIDER_SITE_OTHER): Payer: Medicare Other | Admitting: Urology

## 2021-12-15 ENCOUNTER — Other Ambulatory Visit: Payer: Self-pay

## 2021-12-15 VITALS — BP 146/84 | HR 69 | Ht 73.0 in | Wt 190.0 lb

## 2021-12-15 DIAGNOSIS — N5082 Scrotal pain: Secondary | ICD-10-CM

## 2021-12-15 LAB — URINALYSIS, COMPLETE
Bilirubin, UA: NEGATIVE
Glucose, UA: NEGATIVE
Ketones, UA: NEGATIVE
Nitrite, UA: NEGATIVE
Protein,UA: NEGATIVE
Specific Gravity, UA: 1.015 (ref 1.005–1.030)
Urobilinogen, Ur: 1 mg/dL (ref 0.2–1.0)
pH, UA: 5.5 (ref 5.0–7.5)

## 2021-12-15 LAB — MICROSCOPIC EXAMINATION: RBC, Urine: NONE SEEN /hpf (ref 0–2)

## 2021-12-15 LAB — URINE CULTURE: Culture: <10000 — AB

## 2021-12-16 NOTE — Progress Notes (Signed)
12/23/21 9:14 PM   Ruben Johnson [birth year] [id number]  Referring provider:  No referring provider defined for this encounter. Chief Complaint  Patient presents with   Testicle Pain    Urological history   Left scrotal pain  - On bactrim for scrotal infection  - Exam om 12/15/2021 with Dr Apolinar Junes revealed large 8 cm left nodule with overlying skin changed  - STI testing positive for gonorrheae and chlamydia   2. Erectile dysfunction  - S/p penile prosthesis pump   HPI: Ruben Johnson is a 71 y.o.male who presents today for scrotal recheck.   He is currently on Bactrim and has been experiencing ongoing scrotal pain.   PMH: Past Medical History:  Diagnosis Date   Pancreatitis     Surgical History: No past surgical history on file.  Home Medications:  Allergies as of 12/17/2021   No Known Allergies      Medication List        Accurate as of December 17, 2021 11:59 PM. If you have any questions, ask your nurse or doctor.          STOP taking these medications    sulfamethoxazole-trimethoprim 800-160 MG tablet Commonly known as: Bactrim DS Stopped by: Michiel Cowboy, PA-C       TAKE these medications    doxycycline 100 MG capsule Commonly known as: VIBRAMYCIN Take 1 capsule (100 mg total) by mouth every 12 (twelve) hours. Started by: Michiel Cowboy, PA-C   multivitamin with minerals Tabs tablet Take 1 tablet by mouth daily.        Allergies:  No Known Allergies  Family History: No family history on file.  Social History:  reports that he has quit smoking. He has never used smokeless tobacco. He reports that he does not drink alcohol and does not use drugs.   Physical Exam: BP (!) 142/92    Pulse 86    Ht 6' (1.829 m)    Wt 191 lb (86.6 kg)    BMI 25.90 kg/m   Constitutional:  Alert and oriented, No acute distress. HEENT: [place] AT, moist mucus membranes.  Trachea midline, no masses. Cardiovascular: No clubbing, cyanosis, or  edema. Respiratory: Normal respiratory effort, no increased work of breathing. GI: Abdomen is soft, nontender, nondistended, no abdominal masses GU: Circumcised, cylinder within penis are nontender no tenderness in phallus or prosthesis cylinder. Prosthesis pump identified in right side on crepitus or erythema around pump. Left testicle firm enlarged and indurated and tender to palpation no erythema, crepitus or fluctuation in left hemi scrotum. Tenderness in left cord.  Lymph: No cervical or inguinal lymphadenopathy. Skin: No rashes, bruises or suspicious lesions. Neurologic: Grossly intact, no focal deficits, moving all 4 extremities. Psychiatric: Normal mood and affect.  Laboratory Data: Lab Results  Component Value Date   CREATININE 0.81 12/13/2021   Component     Latest Ref Rng & Units 12/13/2021  Sodium     135 - 145 mmol/L 138  Potassium     3.5 - 5.1 mmol/L 4.1  Chloride     98 - 111 mmol/L 99  CO2     22 - 32 mmol/L 29  Glucose     70 - 99 mg/dL 637 (H)  BUN     8 - 23 mg/dL 14  Creatinine     8.58 - 1.24 mg/dL 8.50  Calcium     8.9 - 10.3 mg/dL 8.9  GFR, Estimated     >60 mL/min >60  Anion gap  5 - 15 10   Component     Latest Ref Rng & Units 12/13/2021  WBC     4.0 - 10.5 K/uL 9.2  RBC     0 - 2 /hpf 5.04  Hemoglobin     13.0 - 17.0 g/dL 36.1  HCT     44.3 - 15.4 % 47.1  MCV     80.0 - 100.0 fL 93.5  MCH     26.0 - 34.0 pg 31.0  MCHC     30.0 - 36.0 g/dL 00.8  RDW     67.6 - 19.5 % 12.9  Platelets     150 - 400 K/uL 214  nRBC     0.0 - 0.2 % 0.0  Neutrophils     % 74  NEUT#     1.7 - 7.7 K/uL 6.8  Lymphocytes     % 13  Lymphocyte #     0.7 - 4.0 K/uL 1.2  Monocytes Relative     % 13  Monocyte #     0.1 - 1.0 K/uL 1.2 (H)  Eosinophil     % 0  Eosinophils Absolute     0.0 - 0.5 K/uL 0.0  Basophil     % 0  Basophils Absolute     0.0 - 0.1 K/uL 0.0  Immature Granulocytes     % 0  Abs Immature Granulocytes     0.00 - 0.07 K/uL  0.03  WBC, UA     0 - 5 /hpf   Epithelial Cells (non renal)     0 - 10 /hpf   Bacteria, UA     None seen/Few     Assessment & Plan:   Left scrotal pain  - Left epididymal orchitis complicated by the presence of penile prosthesis exasperated by infection  - Stop taking bactrim  - Will continue to manage conservatively but with extremely close follow-up   Gonorrhea and Chlamydia  - He tested positive for gonorrhea and chlamydia  - Will treat him with IV Rocephin and doxycycline twice daily. - Given IV rocephin today and prescribed doxycycline.   - Advised him to stop sexual intercourse at this time and if he is to have sexual intercourse to use preventive measures in the form of condoms.    Return in about 1 week (around 12/24/2021) for recheck.  [facility] [address], [address] [place], [place] [address] [phone]  [facility] as a scribe for [facility], PA-C.,have documented all relevant documentation on the behalf of Lilinoe Acklin, PA-C,as directed by  [facility], PA-C while in the presence of Savana Spina, PA-C.

## 2021-12-17 ENCOUNTER — Encounter: Payer: Self-pay | Admitting: Urology

## 2021-12-17 ENCOUNTER — Ambulatory Visit (INDEPENDENT_AMBULATORY_CARE_PROVIDER_SITE_OTHER): Payer: Medicare Other | Admitting: Urology

## 2021-12-17 ENCOUNTER — Other Ambulatory Visit: Payer: Self-pay

## 2021-12-17 VITALS — BP 142/92 | HR 86 | Ht 72.0 in | Wt 191.0 lb

## 2021-12-17 DIAGNOSIS — A549 Gonococcal infection, unspecified: Secondary | ICD-10-CM

## 2021-12-17 DIAGNOSIS — A749 Chlamydial infection, unspecified: Secondary | ICD-10-CM

## 2021-12-17 LAB — GC/CHLAMYDIA PROBE AMP
Chlamydia trachomatis, NAA: POSITIVE — AB
Neisseria Gonorrhoeae by PCR: POSITIVE — AB

## 2021-12-17 MED ORDER — CEFTRIAXONE SODIUM 1 G IJ SOLR
1.0000 g | Freq: Once | INTRAMUSCULAR | Status: AC
  Administered 2021-12-17: 1 g via INTRAMUSCULAR

## 2021-12-17 MED ORDER — DOXYCYCLINE HYCLATE 100 MG PO CAPS: 100.0000 mg | ORAL_CAPSULE | Freq: Two times a day (BID) | ORAL | 0 refills | Status: DC

## 2021-12-17 NOTE — Progress Notes (Signed)
IM Injection  Patient is present today for an IM Injection for treatment of STD. Drug: Ceftriaxone Dose:1g Location:Right upper outer buttocks Lot: [id number] Exp:12/2023 Patient tolerated well, no complications were noted  Performed by: Franchot Erichsen CMA

## 2021-12-23 NOTE — Progress Notes (Signed)
12/26/21 4:57 PM   Ruben Johnson 1951-06-18 [id number]  Referring provider:  No referring provider defined for this encounter.  Chief Complaint  Patient presents with   Follow-up   Urological history: 1. ED -contributing factors of age, former smoker, HLD, HTN and BPH -managed with IPP 2015  HPI: Ruben Johnson is a 71 y.o.male who presents today for scrotal recheck.  He was originally seen by Dr. Erlene Quan on 12/17/2021 and Johnson that time the IPP did not seem infected.  He follow up two days later with me and exam was still reassuring that the IPP was not involved.  His GC/chlamydia tests did return positive.  He received Rocephin in the office and prescribed doxycycline.    Today, he states he is doing well.  He is taking the doxycycline as prescribed.  He is also noticed an improvement in the pain and scrotal enlargement.  He has not cycled the prosthesis since the infection had been diagnosed and he is wondering if it is still functioning.  Patient denies any modifying or aggravating factors.  Patient denies any gross hematuria, dysuria or suprapubic/flank pain.  Patient denies any fevers, chills, nausea or vomiting.      PMH: Past Medical History:  Diagnosis Date   Pancreatitis     Surgical History: No past surgical history on file.  Home Medications:  Allergies as of 12/24/2021   No Known Allergies      Medication List        Accurate as of December 24, 2021 11:59 PM. If you have any questions, ask your nurse or doctor.          doxycycline 100 MG capsule Commonly known as: VIBRAMYCIN Take 1 capsule (100 mg total) by mouth every 12 (twelve) hours.   multivitamin with minerals Tabs tablet Take 1 tablet by mouth daily.        Allergies:  No Known Allergies  Family History: No family history on file.  Social History:  reports that he has quit smoking. He has never used smokeless tobacco. He reports that he does not drink alcohol and does not use  drugs.   Physical Exam: BP (!) 170/77    Pulse 76    Ht 6' (1.829 m)    Wt 191 lb (86.6 kg)    BMI 25.90 kg/m   Constitutional:  Well nourished. Alert and oriented, No acute distress. HEENT: Ruben Johnson, mask in place  trachea midline Cardiovascular: No clubbing, cyanosis, or edema. Respiratory: Normal respiratory effort, no increased work of breathing. GU: No CVA tenderness.  No bladder fullness or masses.  Patient with circumcised phallus. Urethral meatus is patent.  No penile discharge. No penile lesions or rashes.  Prosthesis cylinders are in place with no tenderness on palpation no palpated defects and no erosion, scrotum without lesions, cysts, rashes and/or edema.  Testicles are located scrotally bilaterally. No masses are appreciated in the testicles. Left and right epididymis are normal.  Prosthesis pump is also nontender without palpated defects. Neurologic: Grossly intact, no focal deficits, moving all 4 extremities. Psychiatric: Normal mood and affect.   Laboratory Data: Results for orders placed or performed in visit on 12/15/21  GC/Chlamydia Probe Amp([facility])   Specimen: Urine   UR  Result Value Ref Range   Chlamydia trachomatis, NAA Positive (A) Negative   Neisseria Gonorrhoeae by PCR Positive (A) Negative  Microscopic Examination   Urine  Result Value Ref Range   WBC, UA 11-30 (A) 0 - 5 /hpf  RBC None seen 0 - 2 /hpf   Epithelial Cells (non renal) 0-10 0 - 10 /hpf   Bacteria, UA Few (A) None seen/Few  Urinalysis, Complete  Result Value Ref Range   Specific Gravity, UA 1.015 1.005 - 1.030   pH, UA 5.5 5.0 - 7.5   Color, UA Yellow Yellow   Appearance Ur Cloudy (A) Clear   Leukocytes,UA 3+ (A) Negative   Protein,UA Negative Negative/Trace   Glucose, UA Negative Negative   Ketones, UA Negative Negative   RBC, UA Trace (A) Negative   Bilirubin, UA Negative Negative   Urobilinogen, Ur 1.0 0.2 - 1.0 mg/dL   Nitrite, UA Negative Negative   Microscopic Examination See  below:      Pertinent Imaging N/A   Assessment and Plan:  1. Left epididymitis/orchitis -Resolving -Extended doxycycline for 1 more week as he has a prosthesis in place  2. STI -positive for gonorrhea and chlamydia -will need retesting in three months   Return in about 2 weeks (around 01/07/2022) for recheck .  [facility] [address], Ruben Johnson, [place] [address] [phone]

## 2021-12-24 ENCOUNTER — Other Ambulatory Visit: Payer: Self-pay

## 2021-12-24 ENCOUNTER — Ambulatory Visit (INDEPENDENT_AMBULATORY_CARE_PROVIDER_SITE_OTHER): Payer: Medicare Other | Admitting: Urology

## 2021-12-24 VITALS — BP 170/77 | HR 76 | Ht 72.0 in | Wt 191.0 lb

## 2021-12-24 DIAGNOSIS — A549 Gonococcal infection, unspecified: Secondary | ICD-10-CM

## 2021-12-24 DIAGNOSIS — A749 Chlamydial infection, unspecified: Secondary | ICD-10-CM

## 2021-12-24 MED ORDER — DOXYCYCLINE HYCLATE 100 MG PO CAPS: 100.0000 mg | ORAL_CAPSULE | Freq: Two times a day (BID) | ORAL | 0 refills | Status: DC

## 2021-12-26 ENCOUNTER — Encounter: Payer: Self-pay | Admitting: Urology

## 2022-01-13 NOTE — Progress Notes (Signed)
01/24/22 ?10:10 AM  ? ?Ruben Johnson ?[birth year] ?[id number] ? ?Referring provider:  ?[facility] ?[address] ?[place],  [place] [address] ? ?Chief Complaint  ?Patient presents with  ? Gonorrhea  ? ? ?Urological history  ?1. ED ?-contributing factors of age, former smoker, HLD, HTN and BPH ?-managed with IPP 2015 ? ?2. Left epididymitis/orchitis ?-Extended doxycycline for 1 more week as he has a prosthesis in place on 12/24/2021 ? ?3. STI ?-positive for gonorrhea and chlamydia ? ?HPI: ?Ruben Johnson is a 71 y.o.male who returns today for a 2 week follow-up.  ? ?He has completed his doxycycline and states he is feeling better.  The scrotal swelling and pain have completed abated. ? ?Patient denies any modifying or aggravating factors.  Patient denies any gross hematuria, dysuria or suprapubic/flank pain.  Patient denies any fevers, chills, nausea or vomiting.   ? ? ?PMH: ?Past Medical History:  ?Diagnosis Date  ? Pancreatitis   ? ? ?Surgical History: ?No past surgical history on file. ? ?Home Medications:  ?Allergies as of 01/14/2022   ?No Known Allergies ?  ? ?  ?Medication List  ?  ? ?  ? Accurate as of January 14, 2022 11:59 PM. If you have any questions, ask your nurse or doctor.  ?  ?  ? ?  ? ?STOP taking these medications   ? ?doxycycline 100 MG capsule ?Commonly known as: VIBRAMYCIN ?Stopped by: Michiel Cowboy, PA-C ?  ?multivitamin with minerals Tabs tablet ?Stopped by: Michiel Cowboy, PA-C ?  ? ?  ? ? ?Allergies:  ?No Known Allergies ? ?Family History: ?No family history on file. ? ?Social History:  reports that he has quit smoking. He has never used smokeless tobacco. He reports that he does not drink alcohol and does not use drugs. ? ? ?Physical Exam: ?BP (!) 182/85   Pulse 72   Ht 6' (1.829 m)   Wt 191 lb (86.6 kg)   BMI 25.90 kg/m?   ?Constitutional:  Well nourished. Alert and oriented, No acute distress. ?HEENT: [place] AT, mask in place.  Trachea midline ?Cardiovascular: No clubbing,  cyanosis, or edema. ?Respiratory: Normal respiratory effort, no increased work of breathing. ?GU: No CVA tenderness.  No bladder fullness or masses.  Patient with circumcised phallus.   Urethral meatus is patent.  No penile discharge. No penile lesions or rashes. Scrotum without lesions, cysts, rashes and/or edema.  Testicles are located scrotally bilaterally. No masses are appreciated in the testicles. Left and right epididymis are normal.  Prothesis cylinders in place, reservoir in place and pump in place in scrotum.   We successfully cycled it today.   ?Neurologic: Grossly intact, no focal deficits, moving all 4 extremities. ?Psychiatric: Normal mood and affect.  ? ?Laboratory Data: ?N/A  ? ? ?Pertinent Imaging: ?N/A ? ?Assessment & Plan:   ? ?1. Left epididymitis/orchitis ?-resolved ? ?2. STI ?-will need to retest in three months per CDC guidelines to ensure resolution of infection ?-advised patient to use condoms when sexually activity ? ? ?Return in about 3 months (around 04/16/2022) for retest for GC/chlamydia . ? ?[facility] ?[address] ?[place], [place] [address] ?[phone] ? ?I,Vee Bahe,acting as a scribe for [employer], PA-C.,have documented all relevant documentation on the behalf of [name], PA-C,as directed by  [facility], PA-C while in the presence of Tiffiny Worthy, PA-C. ? ?I have reviewed the above documentation for accuracy and completeness, and I agree with the above.   ? ?  Michiel Cowboy, PA-C  ? ?I spent 15 minutes on the day of the encounter to include pre-visit record review, face-to-face time with the patient, and post-visit ordering of tests.  ?

## 2022-01-14 ENCOUNTER — Other Ambulatory Visit: Payer: Self-pay

## 2022-01-14 ENCOUNTER — Ambulatory Visit (INDEPENDENT_AMBULATORY_CARE_PROVIDER_SITE_OTHER): Payer: Medicare HMO | Admitting: Urology

## 2022-01-14 VITALS — BP 182/85 | HR 72 | Ht 72.0 in | Wt 191.0 lb

## 2022-01-14 DIAGNOSIS — N453 Epididymo-orchitis: Secondary | ICD-10-CM

## 2022-01-14 DIAGNOSIS — A549 Gonococcal infection, unspecified: Secondary | ICD-10-CM | POA: Diagnosis not present

## 2022-01-24 ENCOUNTER — Encounter: Payer: Self-pay | Admitting: Urology

## 2022-04-18 DIAGNOSIS — IMO0001 Reserved for inherently not codable concepts without codable children: Secondary | ICD-10-CM | POA: Insufficient documentation

## 2022-04-18 DIAGNOSIS — R030 Elevated blood-pressure reading, without diagnosis of hypertension: Secondary | ICD-10-CM | POA: Insufficient documentation

## 2022-04-18 NOTE — Progress Notes (Unsigned)
04/19/22 9:45 AM   Ruben Johnson 07/17/1951 [id number]  Referring provider:  [facility] [address] [place],  [place] [address]  Urological history  1. ED -contributing factors of age, former smoker, HLD, HTN and BPH -IPP, 2015  2. Left epididymitis/orchitis -+ GC/chlamydia  -resolved 12/2021  3. STI -positive for gonorrhea and chlamydia,2023  Chief Complaint  Patient presents with   Follow-up     HPI: Ruben Johnson is a 71 y.o.male who returns today for a 3 month follow up.    He presented in February with left epididymitis/orchitis which was positive with GC and chlamydia.  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  He was placed on doxycycline and given 1 g of Rocephin in the office and educated on safe sexual practices.  He had full resolution of the epididymitis/orchitis in March 2023.  He feels well at this visit.  He is using condoms when having intercourse.   Patient denies any modifying or aggravating factors.  Patient denies any gross hematuria, dysuria or suprapubic/flank pain.  Patient denies any fevers, chills, nausea or vomiting.     PMH: Past Medical History:  Diagnosis Date   Pancreatitis     Surgical History: No past surgical history on file.  Home Medications:  Allergies as of 04/19/2022   No Known Allergies      Medication List    as of April 19, 2022  9:45 AM   You have not been prescribed any medications.     Allergies:  No Known Allergies  Family History: No family history on file.  Social History:  reports that he has quit smoking. He has never used smokeless tobacco. He reports that he does not drink alcohol and does not use drugs.   Physical Exam: BP (!) 154/78   Pulse 62   Ht 6' (1.829 m)   Wt 195 lb (88.5 kg)   BMI 26.45 kg/m   Constitutional:  Well nourished. Alert and oriented, No acute distress. HEENT: [place] AT, moist mucus membranes.  Trachea midline Cardiovascular: No clubbing, cyanosis, or edema. Respiratory: Normal  respiratory effort, no increased work of breathing. Neurologic: Grossly intact, no focal deficits, moving all 4 extremities. Psychiatric: Normal mood and affect.   Laboratory Data: N/A    Pertinent Imaging: N/A  Assessment & Plan:    1. Left epididymitis/orchitis -resolved  2. STI -repeat GC/chlamydia PCR today   Return for pending lab work .  Michiel Cowboy, PA-C   [facility] [address], [address] [place], [place] [address] [phone]

## 2022-04-19 ENCOUNTER — Ambulatory Visit (INDEPENDENT_AMBULATORY_CARE_PROVIDER_SITE_OTHER): Payer: Medicare HMO | Admitting: Urology

## 2022-04-19 ENCOUNTER — Encounter: Payer: Self-pay | Admitting: Urology

## 2022-04-19 VITALS — BP 154/78 | HR 62 | Ht 72.0 in | Wt 195.0 lb

## 2022-04-19 DIAGNOSIS — N453 Epididymo-orchitis: Secondary | ICD-10-CM

## 2022-04-19 DIAGNOSIS — A749 Chlamydial infection, unspecified: Secondary | ICD-10-CM | POA: Diagnosis not present

## 2022-04-19 DIAGNOSIS — A549 Gonococcal infection, unspecified: Secondary | ICD-10-CM

## 2022-04-21 ENCOUNTER — Telehealth: Payer: Self-pay

## 2022-04-21 LAB — GC/CHLAMYDIA PROBE AMP
Chlamydia trachomatis, NAA: NEGATIVE
Neisseria Gonorrhoeae by PCR: NEGATIVE

## 2022-04-21 NOTE — Telephone Encounter (Signed)
-----   Message from Harle Battiest, PA-C sent at 04/21/2022  7:59 AM EDT ----- Please let Mr. Sass know that his GC/Chlamydia tests are negative.

## 2022-04-21 NOTE — Telephone Encounter (Signed)
Pt notified and voiced understanding 

## 2023-07-07 IMAGING — US US SCROTUM W/ DOPPLER COMPLETE
1 series · 13 of 25 positions shown · non-contrast
Comparison: May 13, 2020.

CLINICAL DATA: Acute left scrotal pain and swelling.

EXAM:
SCROTAL ULTRASOUND
DOPPLER ULTRASOUND OF THE TESTICLES
TECHNIQUE: Complete ultrasound examination of the testicles, epididymis, and
other scrotal structures was performed. Color and spectral Doppler
ultrasound were also utilized to evaluate blood flow to the
testicles.

[Series 1: us scrotum w/doppler · 13 of 63 slices shown]
[im 1/63]
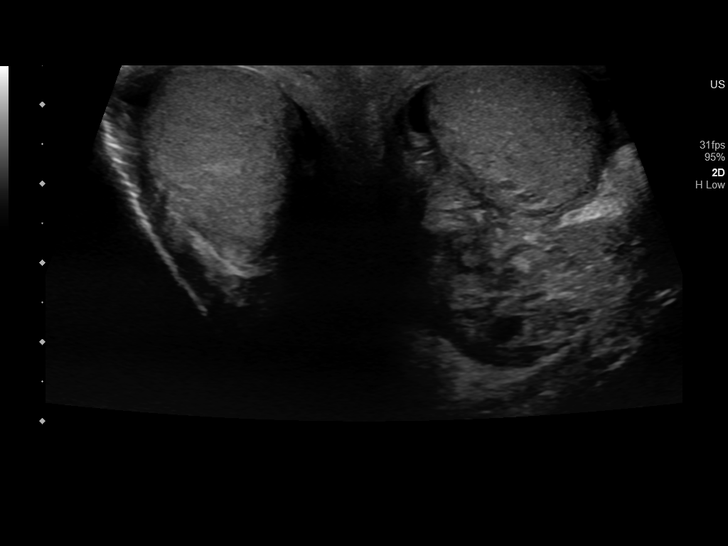
[im 6/63]
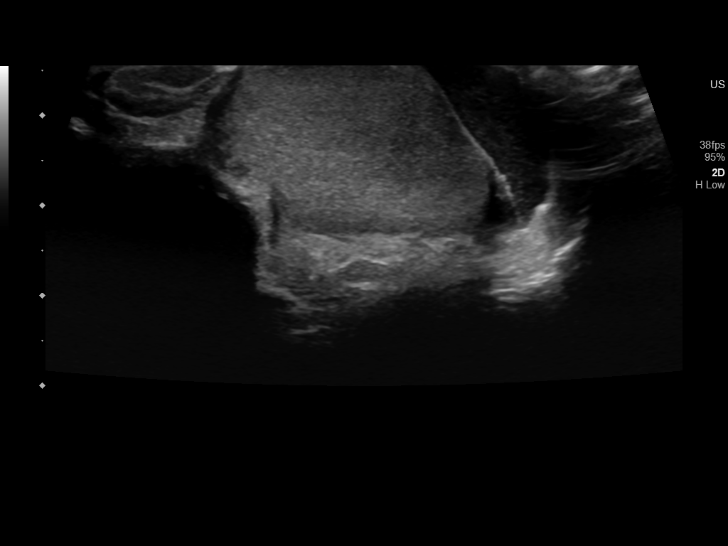
[im 11/63]
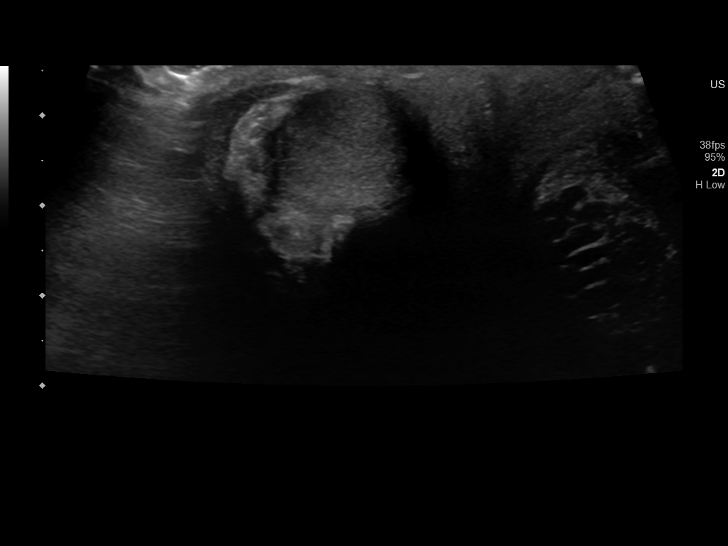
[im 16/63]
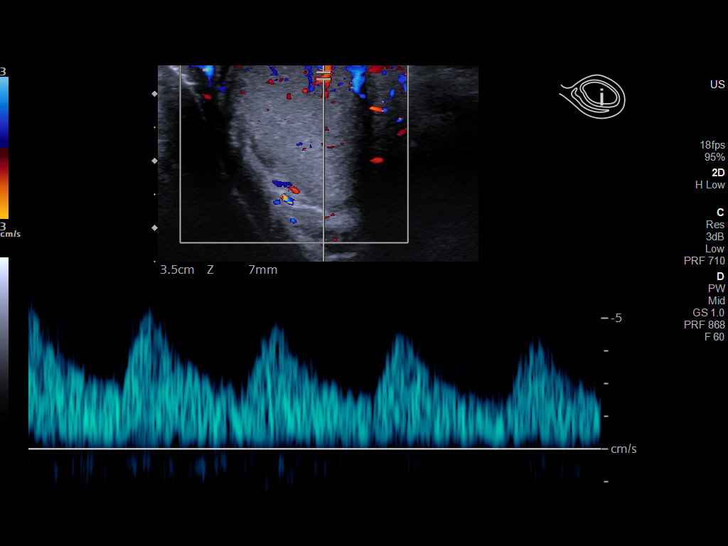
[im 21/63]
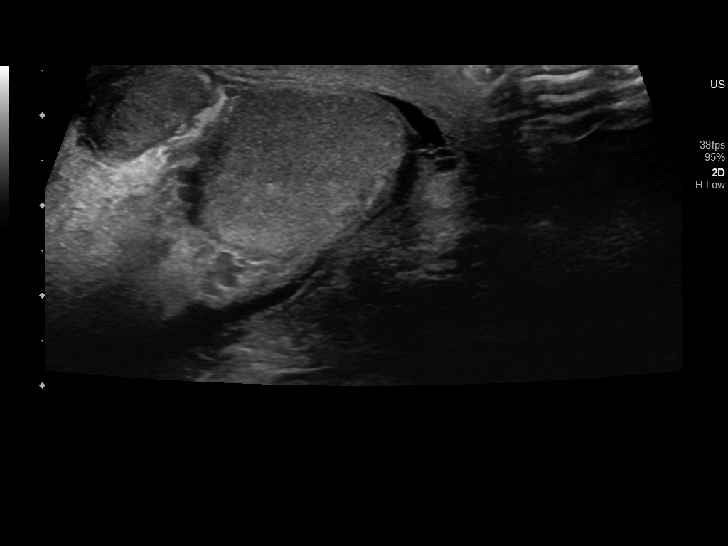
[im 26/63]
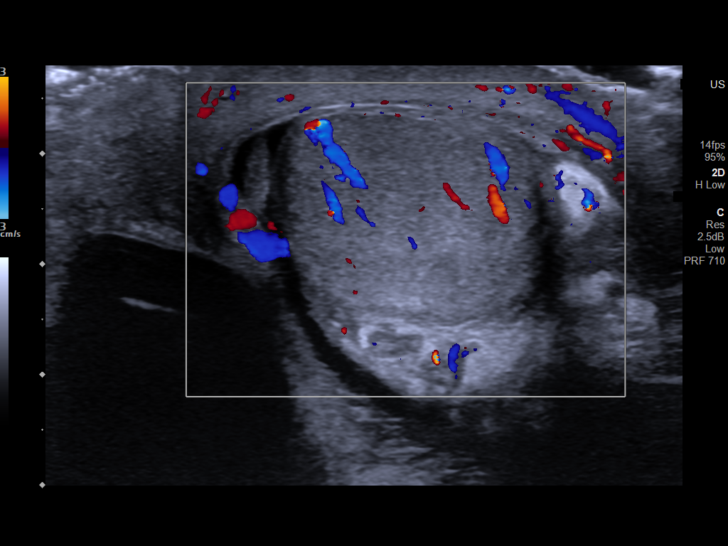
[im 32/63]
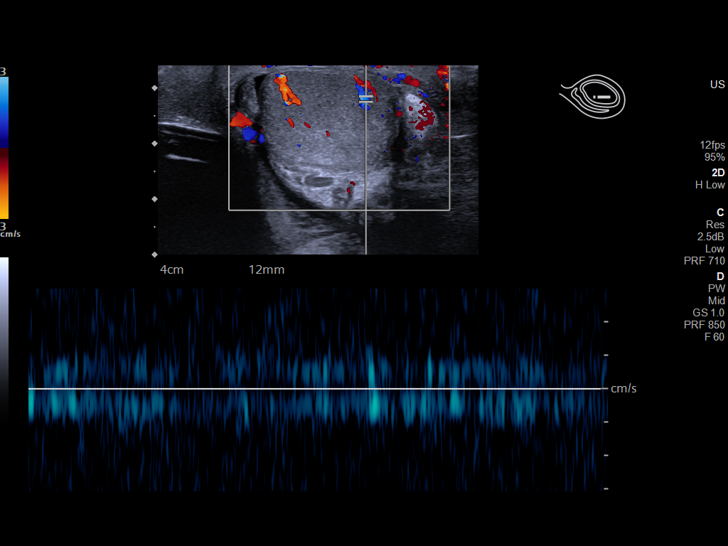
[im 37/63]
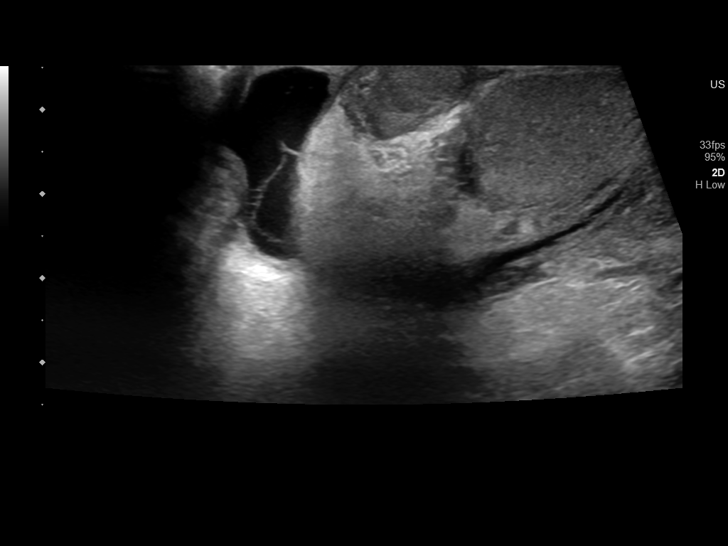
[im 42/63]
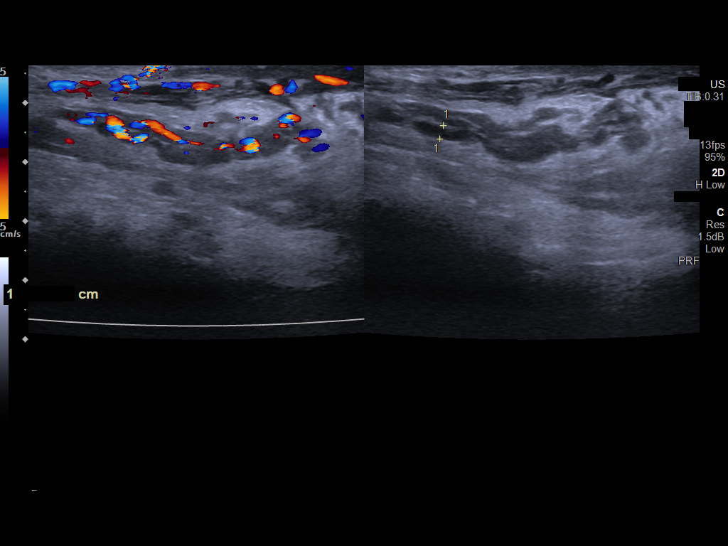
[im 47/63]
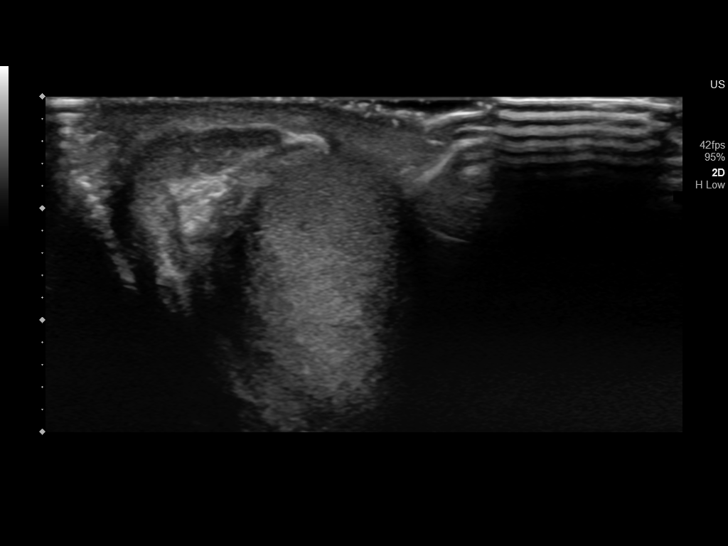
[im 52/63]
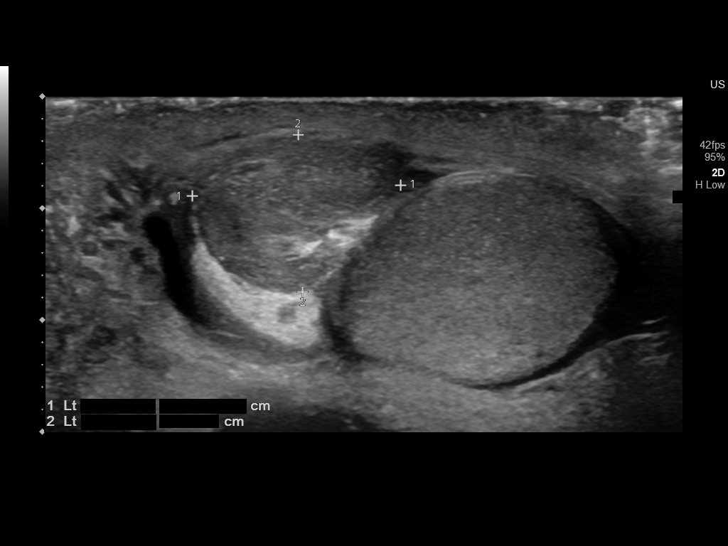
[im 57/63]
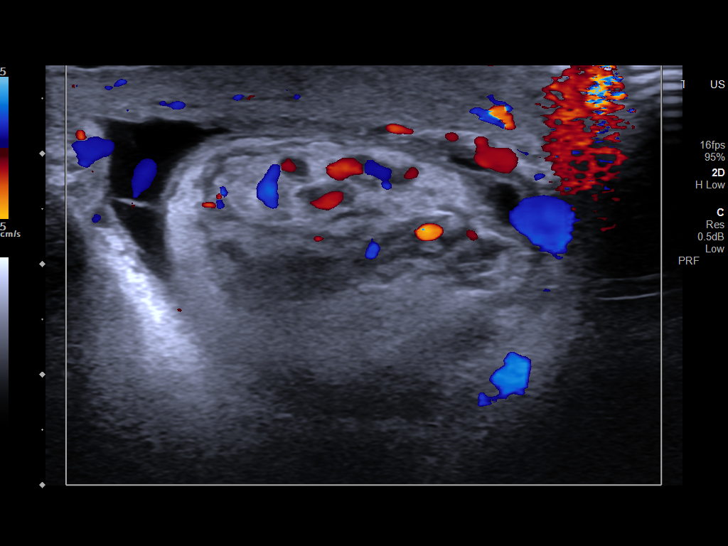
[im 63/63]
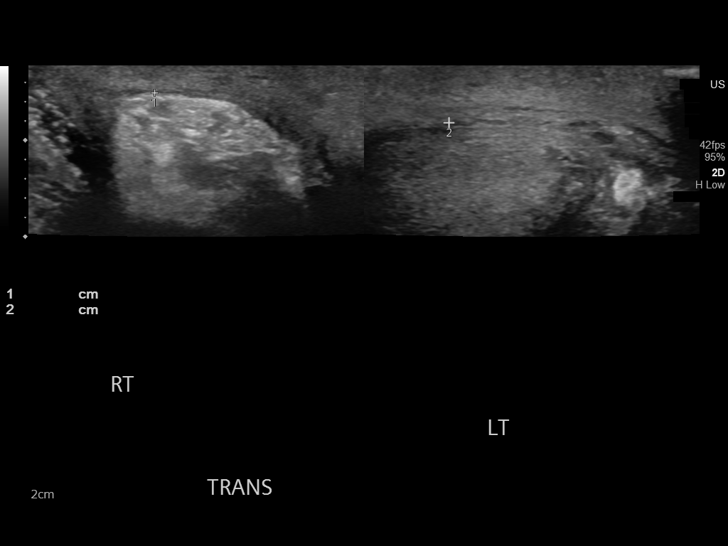

[13 of 25 positions shown; findings below may reference images not displayed]

FINDINGS: Right testicle

Measurements: 3.4 x 2.2 x 2.0 cm. No mass or microlithiasis
visualized.

Left testicle

Measurements: 2.9 x 2.4 x 1.7 cm. No mass or microlithiasis
visualized.

Right epididymis:  Normal in size and appearance.

Left epididymis: Enlarged and hypervascular concerning for
epididymitis.

Hydrocele:  Small bilateral hydroceles are noted.

Varicocele:  None visualized.

Pulsed Doppler interrogation of both testes demonstrates normal low
resistance arterial and venous waveforms bilaterally.

Possible left inguinal hernia is noted which may be extending into
the scrotum.
IMPRESSION: No evidence of testicular mass or torsion. Enlarged and
hypervascular left epididymis is noted concerning for epididymitis.
Small bilateral hydroceles are noted. Possible left inguinal hernia
is noted which may be extending into the scrotum; CT scan may be
performed for further evaluation.
